# Patient Record
Sex: Female | Born: 1955
Health system: Southern US, Community
[De-identification: ages and names within clinical notes are randomized; demographics above are authoritative.]

## PROBLEM LIST (undated history)

## (undated) DIAGNOSIS — K219 Gastro-esophageal reflux disease without esophagitis: Secondary | ICD-10-CM

## (undated) DIAGNOSIS — G43909 Migraine, unspecified, not intractable, without status migrainosus: Secondary | ICD-10-CM

## (undated) DIAGNOSIS — F419 Anxiety disorder, unspecified: Secondary | ICD-10-CM

## (undated) DIAGNOSIS — F32A Depression, unspecified: Secondary | ICD-10-CM

## (undated) DIAGNOSIS — Z87442 Personal history of urinary calculi: Secondary | ICD-10-CM

## (undated) DIAGNOSIS — I2510 Atherosclerotic heart disease of native coronary artery without angina pectoris: Secondary | ICD-10-CM

## (undated) DIAGNOSIS — K449 Diaphragmatic hernia without obstruction or gangrene: Secondary | ICD-10-CM

## (undated) DIAGNOSIS — G250 Essential tremor: Secondary | ICD-10-CM

## (undated) DIAGNOSIS — E039 Hypothyroidism, unspecified: Secondary | ICD-10-CM

## (undated) DIAGNOSIS — C44309 Unspecified malignant neoplasm of skin of other parts of face: Secondary | ICD-10-CM

## (undated) DIAGNOSIS — Z8719 Personal history of other diseases of the digestive system: Secondary | ICD-10-CM

## (undated) DIAGNOSIS — E785 Hyperlipidemia, unspecified: Secondary | ICD-10-CM

## (undated) DIAGNOSIS — E041 Nontoxic single thyroid nodule: Secondary | ICD-10-CM

## (undated) DIAGNOSIS — I456 Pre-excitation syndrome: Secondary | ICD-10-CM

## (undated) DIAGNOSIS — R739 Hyperglycemia, unspecified: Secondary | ICD-10-CM

## (undated) DIAGNOSIS — I471 Supraventricular tachycardia, unspecified: Secondary | ICD-10-CM

## (undated) DIAGNOSIS — K760 Fatty (change of) liver, not elsewhere classified: Secondary | ICD-10-CM

## (undated) DIAGNOSIS — F329 Major depressive disorder, single episode, unspecified: Secondary | ICD-10-CM

## (undated) DIAGNOSIS — M1990 Unspecified osteoarthritis, unspecified site: Secondary | ICD-10-CM

## (undated) HISTORY — PX: ABLATION: SHX5711

## (undated) HISTORY — DX: Diaphragmatic hernia without obstruction or gangrene: K44.9

## (undated) HISTORY — DX: Supraventricular tachycardia: I47.1

## (undated) HISTORY — DX: Personal history of other diseases of the digestive system: Z87.19

## (undated) HISTORY — DX: Fatty (change of) liver, not elsewhere classified: K76.0

## (undated) HISTORY — DX: Nontoxic single thyroid nodule: E04.1

## (undated) HISTORY — DX: Atherosclerotic heart disease of native coronary artery without angina pectoris: I25.10

## (undated) HISTORY — DX: Essential tremor: G25.0

## (undated) HISTORY — PX: TUBAL LIGATION: SHX77

## (undated) HISTORY — DX: Hyperlipidemia, unspecified: E78.5

## (undated) HISTORY — DX: Pre-excitation syndrome: I45.6

## (undated) HISTORY — DX: Hypothyroidism, unspecified: E03.9

## (undated) HISTORY — DX: Hyperglycemia, unspecified: R73.9

## (undated) HISTORY — DX: Supraventricular tachycardia, unspecified: I47.10

## (undated) HISTORY — PX: LAPAROSCOPIC CHOLECYSTECTOMY W/ CHOLANGIOGRAPHY: SUR757

## (undated) HISTORY — DX: Gastro-esophageal reflux disease without esophagitis: K21.9

## (undated) HISTORY — PX: MOHS SURGERY: SUR867

## (undated) HISTORY — PX: BIOPSY THYROID: PRO38

---

## 1998-09-19 ENCOUNTER — Emergency Department (HOSPITAL_COMMUNITY): Admission: EM | Admit: 1998-09-19 | Discharge: 1998-09-19 | Payer: Self-pay

## 1998-09-29 ENCOUNTER — Encounter: Payer: Self-pay | Admitting: Endocrinology

## 1998-09-29 ENCOUNTER — Ambulatory Visit (HOSPITAL_COMMUNITY): Admission: RE | Admit: 1998-09-29 | Discharge: 1998-09-29 | Payer: Self-pay | Admitting: Endocrinology

## 1998-09-30 ENCOUNTER — Ambulatory Visit (HOSPITAL_COMMUNITY): Admission: RE | Admit: 1998-09-30 | Discharge: 1998-09-30 | Payer: Self-pay | Admitting: Endocrinology

## 1999-05-04 ENCOUNTER — Emergency Department (HOSPITAL_COMMUNITY): Admission: EM | Admit: 1999-05-04 | Discharge: 1999-05-04 | Payer: Self-pay | Admitting: Emergency Medicine

## 1999-05-04 ENCOUNTER — Encounter: Payer: Self-pay | Admitting: Endocrinology

## 1999-10-14 ENCOUNTER — Other Ambulatory Visit: Admission: RE | Admit: 1999-10-14 | Discharge: 1999-10-14 | Payer: Self-pay | Admitting: Obstetrics and Gynecology

## 2000-12-15 ENCOUNTER — Other Ambulatory Visit: Admission: RE | Admit: 2000-12-15 | Discharge: 2000-12-15 | Payer: Self-pay | Admitting: Obstetrics and Gynecology

## 2001-12-19 ENCOUNTER — Other Ambulatory Visit: Admission: RE | Admit: 2001-12-19 | Discharge: 2001-12-19 | Payer: Self-pay | Admitting: Obstetrics and Gynecology

## 2002-11-03 ENCOUNTER — Emergency Department (HOSPITAL_COMMUNITY): Admission: EM | Admit: 2002-11-03 | Discharge: 2002-11-04 | Payer: Self-pay | Admitting: Emergency Medicine

## 2002-12-25 ENCOUNTER — Other Ambulatory Visit: Admission: RE | Admit: 2002-12-25 | Discharge: 2002-12-25 | Payer: Self-pay | Admitting: Obstetrics and Gynecology

## 2004-01-02 ENCOUNTER — Other Ambulatory Visit: Admission: RE | Admit: 2004-01-02 | Discharge: 2004-01-02 | Payer: Self-pay | Admitting: Obstetrics and Gynecology

## 2004-07-15 ENCOUNTER — Encounter: Payer: Self-pay | Admitting: Internal Medicine

## 2004-08-03 ENCOUNTER — Encounter: Admission: RE | Admit: 2004-08-03 | Discharge: 2004-08-03 | Payer: Self-pay | Admitting: Internal Medicine

## 2004-09-03 ENCOUNTER — Ambulatory Visit (HOSPITAL_COMMUNITY): Admission: RE | Admit: 2004-09-03 | Discharge: 2004-09-03 | Payer: Self-pay | Admitting: Urology

## 2004-09-09 ENCOUNTER — Ambulatory Visit: Payer: Self-pay | Admitting: Cardiology

## 2004-09-16 ENCOUNTER — Ambulatory Visit: Payer: Self-pay | Admitting: Cardiology

## 2004-10-17 ENCOUNTER — Emergency Department (HOSPITAL_COMMUNITY): Admission: EM | Admit: 2004-10-17 | Discharge: 2004-10-17 | Payer: Self-pay | Admitting: Emergency Medicine

## 2004-10-22 ENCOUNTER — Ambulatory Visit: Payer: Self-pay | Admitting: *Deleted

## 2004-11-16 ENCOUNTER — Ambulatory Visit: Payer: Self-pay | Admitting: Cardiology

## 2004-12-11 ENCOUNTER — Ambulatory Visit: Payer: Self-pay | Admitting: Cardiology

## 2005-01-06 ENCOUNTER — Other Ambulatory Visit: Admission: RE | Admit: 2005-01-06 | Discharge: 2005-01-06 | Payer: Self-pay | Admitting: Obstetrics and Gynecology

## 2005-01-12 ENCOUNTER — Encounter: Admission: RE | Admit: 2005-01-12 | Discharge: 2005-04-12 | Payer: Self-pay | Admitting: Obstetrics and Gynecology

## 2005-07-08 ENCOUNTER — Ambulatory Visit: Payer: Self-pay | Admitting: Cardiology

## 2005-08-04 ENCOUNTER — Emergency Department (HOSPITAL_COMMUNITY): Admission: EM | Admit: 2005-08-04 | Discharge: 2005-08-04 | Payer: Self-pay | Admitting: Emergency Medicine

## 2005-08-19 ENCOUNTER — Ambulatory Visit: Payer: Self-pay

## 2005-08-23 ENCOUNTER — Ambulatory Visit: Payer: Self-pay

## 2005-09-02 ENCOUNTER — Ambulatory Visit: Payer: Self-pay | Admitting: Cardiology

## 2005-11-01 HISTORY — PX: CARDIAC ELECTROPHYSIOLOGY MAPPING AND ABLATION: SHX1292

## 2005-11-24 ENCOUNTER — Emergency Department (HOSPITAL_COMMUNITY): Admission: EM | Admit: 2005-11-24 | Discharge: 2005-11-24 | Payer: Self-pay | Admitting: Emergency Medicine

## 2005-12-16 ENCOUNTER — Ambulatory Visit: Payer: Self-pay | Admitting: Cardiology

## 2006-01-03 ENCOUNTER — Ambulatory Visit: Payer: Self-pay | Admitting: Internal Medicine

## 2006-01-10 ENCOUNTER — Inpatient Hospital Stay (HOSPITAL_COMMUNITY): Admission: AD | Admit: 2006-01-10 | Discharge: 2006-01-10 | Payer: Self-pay | Admitting: Internal Medicine

## 2006-01-10 ENCOUNTER — Ambulatory Visit: Payer: Self-pay | Admitting: Internal Medicine

## 2006-01-18 ENCOUNTER — Other Ambulatory Visit: Admission: RE | Admit: 2006-01-18 | Discharge: 2006-01-18 | Payer: Self-pay | Admitting: Obstetrics and Gynecology

## 2006-01-18 ENCOUNTER — Ambulatory Visit: Payer: Self-pay | Admitting: Cardiology

## 2006-01-31 ENCOUNTER — Other Ambulatory Visit: Admission: RE | Admit: 2006-01-31 | Discharge: 2006-01-31 | Payer: Self-pay | Admitting: Interventional Radiology

## 2006-01-31 ENCOUNTER — Encounter: Admission: RE | Admit: 2006-01-31 | Discharge: 2006-01-31 | Payer: Self-pay | Admitting: Internal Medicine

## 2006-01-31 ENCOUNTER — Encounter (INDEPENDENT_AMBULATORY_CARE_PROVIDER_SITE_OTHER): Payer: Self-pay | Admitting: Specialist

## 2006-02-02 ENCOUNTER — Inpatient Hospital Stay (HOSPITAL_COMMUNITY): Admission: AD | Admit: 2006-02-02 | Discharge: 2006-02-04 | Payer: Self-pay | Admitting: Cardiology

## 2006-02-02 ENCOUNTER — Ambulatory Visit: Payer: Self-pay | Admitting: Cardiology

## 2006-02-04 ENCOUNTER — Encounter (INDEPENDENT_AMBULATORY_CARE_PROVIDER_SITE_OTHER): Payer: Self-pay | Admitting: *Deleted

## 2006-02-21 ENCOUNTER — Ambulatory Visit: Payer: Self-pay | Admitting: Internal Medicine

## 2006-03-25 ENCOUNTER — Ambulatory Visit: Payer: Self-pay | Admitting: Cardiology

## 2007-06-07 ENCOUNTER — Encounter: Admission: RE | Admit: 2007-06-07 | Discharge: 2007-06-07 | Payer: Self-pay | Admitting: Internal Medicine

## 2007-07-20 ENCOUNTER — Other Ambulatory Visit: Admission: RE | Admit: 2007-07-20 | Discharge: 2007-07-20 | Payer: Self-pay | Admitting: Diagnostic Radiology

## 2007-07-20 ENCOUNTER — Encounter (INDEPENDENT_AMBULATORY_CARE_PROVIDER_SITE_OTHER): Payer: Self-pay | Admitting: Diagnostic Radiology

## 2007-07-20 ENCOUNTER — Encounter: Admission: RE | Admit: 2007-07-20 | Discharge: 2007-07-20 | Payer: Self-pay | Admitting: General Surgery

## 2007-09-13 ENCOUNTER — Emergency Department (HOSPITAL_COMMUNITY): Admission: EM | Admit: 2007-09-13 | Discharge: 2007-09-14 | Payer: Self-pay | Admitting: Emergency Medicine

## 2007-09-13 ENCOUNTER — Encounter (INDEPENDENT_AMBULATORY_CARE_PROVIDER_SITE_OTHER): Payer: Self-pay | Admitting: *Deleted

## 2007-10-03 ENCOUNTER — Encounter: Admission: RE | Admit: 2007-10-03 | Discharge: 2007-10-03 | Payer: Self-pay | Admitting: Internal Medicine

## 2007-11-08 ENCOUNTER — Encounter (INDEPENDENT_AMBULATORY_CARE_PROVIDER_SITE_OTHER): Payer: Self-pay | Admitting: General Surgery

## 2007-11-08 ENCOUNTER — Ambulatory Visit (HOSPITAL_COMMUNITY): Admission: RE | Admit: 2007-11-08 | Discharge: 2007-11-09 | Payer: Self-pay | Admitting: General Surgery

## 2007-11-08 HISTORY — PX: TOTAL THYROIDECTOMY: SHX2547

## 2007-11-12 ENCOUNTER — Emergency Department (HOSPITAL_COMMUNITY): Admission: EM | Admit: 2007-11-12 | Discharge: 2007-11-12 | Payer: Self-pay | Admitting: Emergency Medicine

## 2008-07-31 ENCOUNTER — Encounter: Admission: RE | Admit: 2008-07-31 | Discharge: 2008-07-31 | Payer: Self-pay | Admitting: Internal Medicine

## 2008-07-31 ENCOUNTER — Encounter (INDEPENDENT_AMBULATORY_CARE_PROVIDER_SITE_OTHER): Payer: Self-pay | Admitting: *Deleted

## 2008-08-21 ENCOUNTER — Encounter (INDEPENDENT_AMBULATORY_CARE_PROVIDER_SITE_OTHER): Payer: Self-pay | Admitting: *Deleted

## 2008-08-21 ENCOUNTER — Encounter (INDEPENDENT_AMBULATORY_CARE_PROVIDER_SITE_OTHER): Payer: Self-pay | Admitting: General Surgery

## 2008-08-21 ENCOUNTER — Ambulatory Visit (HOSPITAL_COMMUNITY): Admission: RE | Admit: 2008-08-21 | Discharge: 2008-08-22 | Payer: Self-pay | Admitting: General Surgery

## 2008-12-25 ENCOUNTER — Ambulatory Visit: Payer: Self-pay | Admitting: Cardiology

## 2008-12-25 ENCOUNTER — Encounter: Payer: Self-pay | Admitting: Physician Assistant

## 2008-12-25 DIAGNOSIS — I471 Supraventricular tachycardia: Secondary | ICD-10-CM | POA: Insufficient documentation

## 2008-12-25 DIAGNOSIS — R079 Chest pain, unspecified: Secondary | ICD-10-CM | POA: Insufficient documentation

## 2008-12-25 DIAGNOSIS — E785 Hyperlipidemia, unspecified: Secondary | ICD-10-CM | POA: Insufficient documentation

## 2008-12-25 DIAGNOSIS — R12 Heartburn: Secondary | ICD-10-CM | POA: Insufficient documentation

## 2008-12-25 DIAGNOSIS — E781 Pure hyperglyceridemia: Secondary | ICD-10-CM | POA: Insufficient documentation

## 2009-01-06 ENCOUNTER — Telehealth: Payer: Self-pay | Admitting: Internal Medicine

## 2009-01-07 DIAGNOSIS — E039 Hypothyroidism, unspecified: Secondary | ICD-10-CM | POA: Insufficient documentation

## 2009-01-07 DIAGNOSIS — E041 Nontoxic single thyroid nodule: Secondary | ICD-10-CM | POA: Insufficient documentation

## 2009-01-07 DIAGNOSIS — Z87442 Personal history of urinary calculi: Secondary | ICD-10-CM | POA: Insufficient documentation

## 2009-01-07 DIAGNOSIS — K589 Irritable bowel syndrome without diarrhea: Secondary | ICD-10-CM | POA: Insufficient documentation

## 2009-01-08 ENCOUNTER — Ambulatory Visit: Payer: Self-pay | Admitting: Internal Medicine

## 2009-01-08 DIAGNOSIS — Z9089 Acquired absence of other organs: Secondary | ICD-10-CM | POA: Insufficient documentation

## 2009-01-27 ENCOUNTER — Ambulatory Visit: Payer: Self-pay | Admitting: Internal Medicine

## 2009-01-29 ENCOUNTER — Encounter: Payer: Self-pay | Admitting: Internal Medicine

## 2009-02-11 ENCOUNTER — Telehealth: Payer: Self-pay | Admitting: Internal Medicine

## 2009-02-24 ENCOUNTER — Ambulatory Visit: Payer: Self-pay | Admitting: Internal Medicine

## 2009-02-25 LAB — CONVERTED CEMR LAB
ALT: 39 units/L — ABNORMAL HIGH (ref 0–35)
AST: 35 units/L (ref 0–37)
Albumin: 4.2 g/dL (ref 3.5–5.2)
Alkaline Phosphatase: 44 units/L (ref 39–117)
Bilirubin, Direct: 0.1 mg/dL (ref 0.0–0.3)
Total Bilirubin: 0.5 mg/dL (ref 0.3–1.2)
Total Protein: 7.6 g/dL (ref 6.0–8.3)

## 2009-02-26 LAB — CONVERTED CEMR LAB: Tissue Transglutaminase Ab, IgA: 1.8 units (ref <7)

## 2009-03-22 IMAGING — CR DG ABDOMEN ACUTE W/ 1V CHEST
3 series · 3 of 3 positions shown · non-contrast
Comparison: none

CLINICAL DATA: Nausea, weakness, vomiting.  
ACUTE ABDOMINAL SERIES:

[w chest pa]
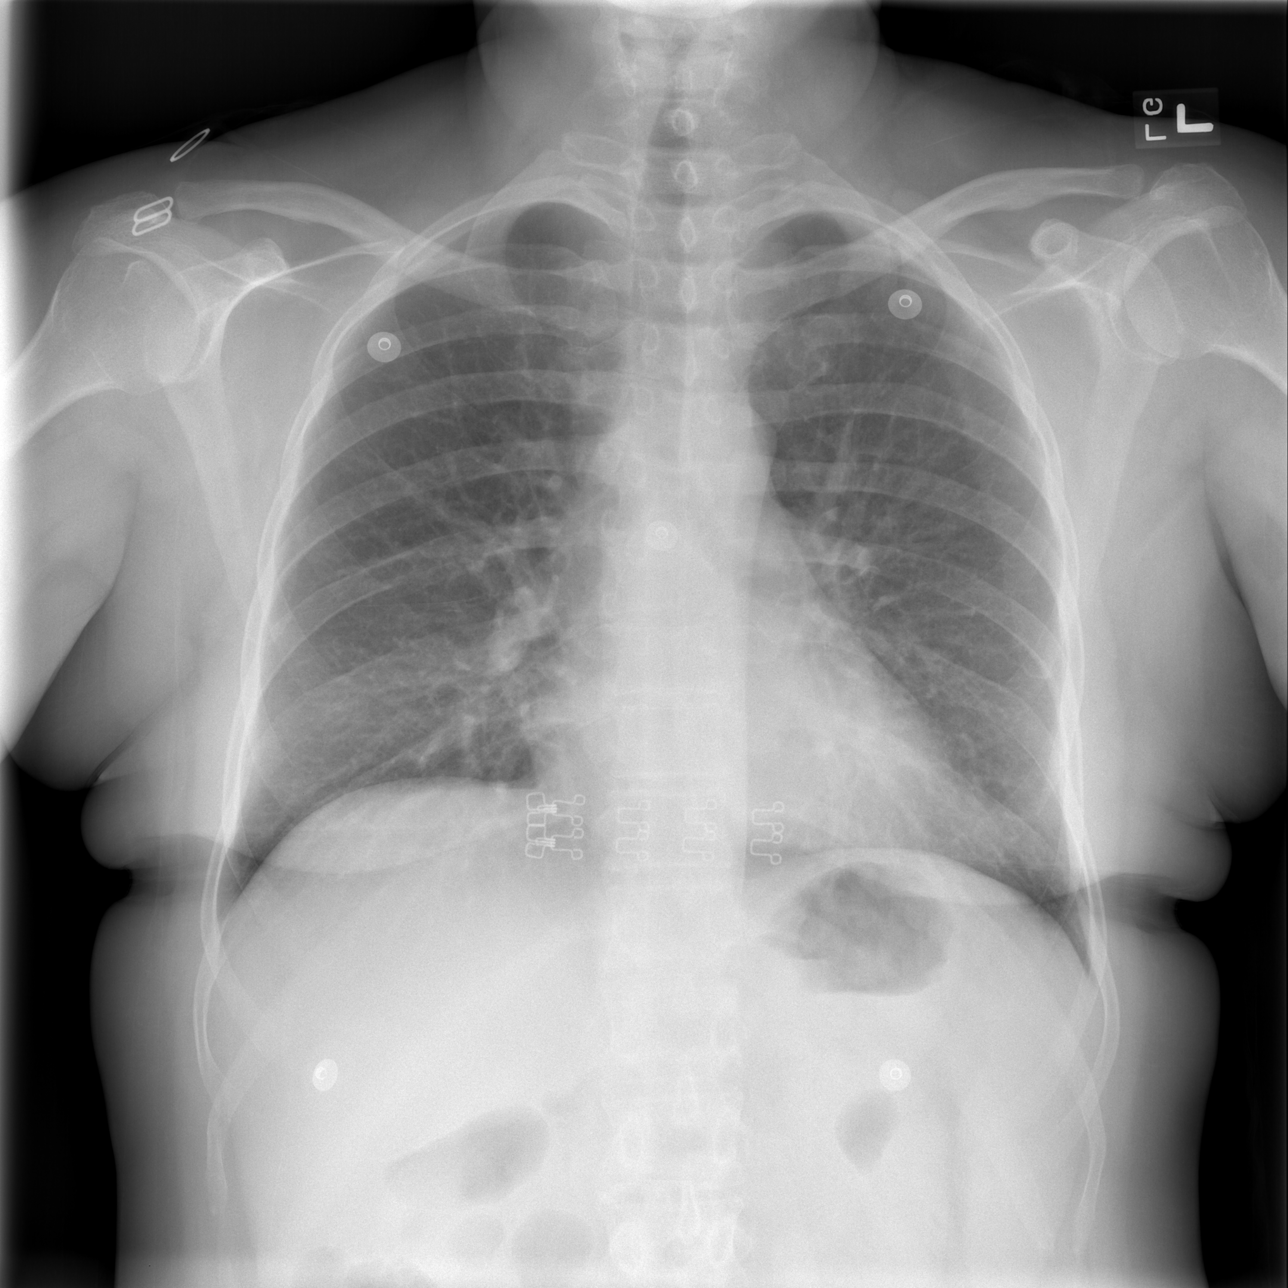

[w abdomen upright *]
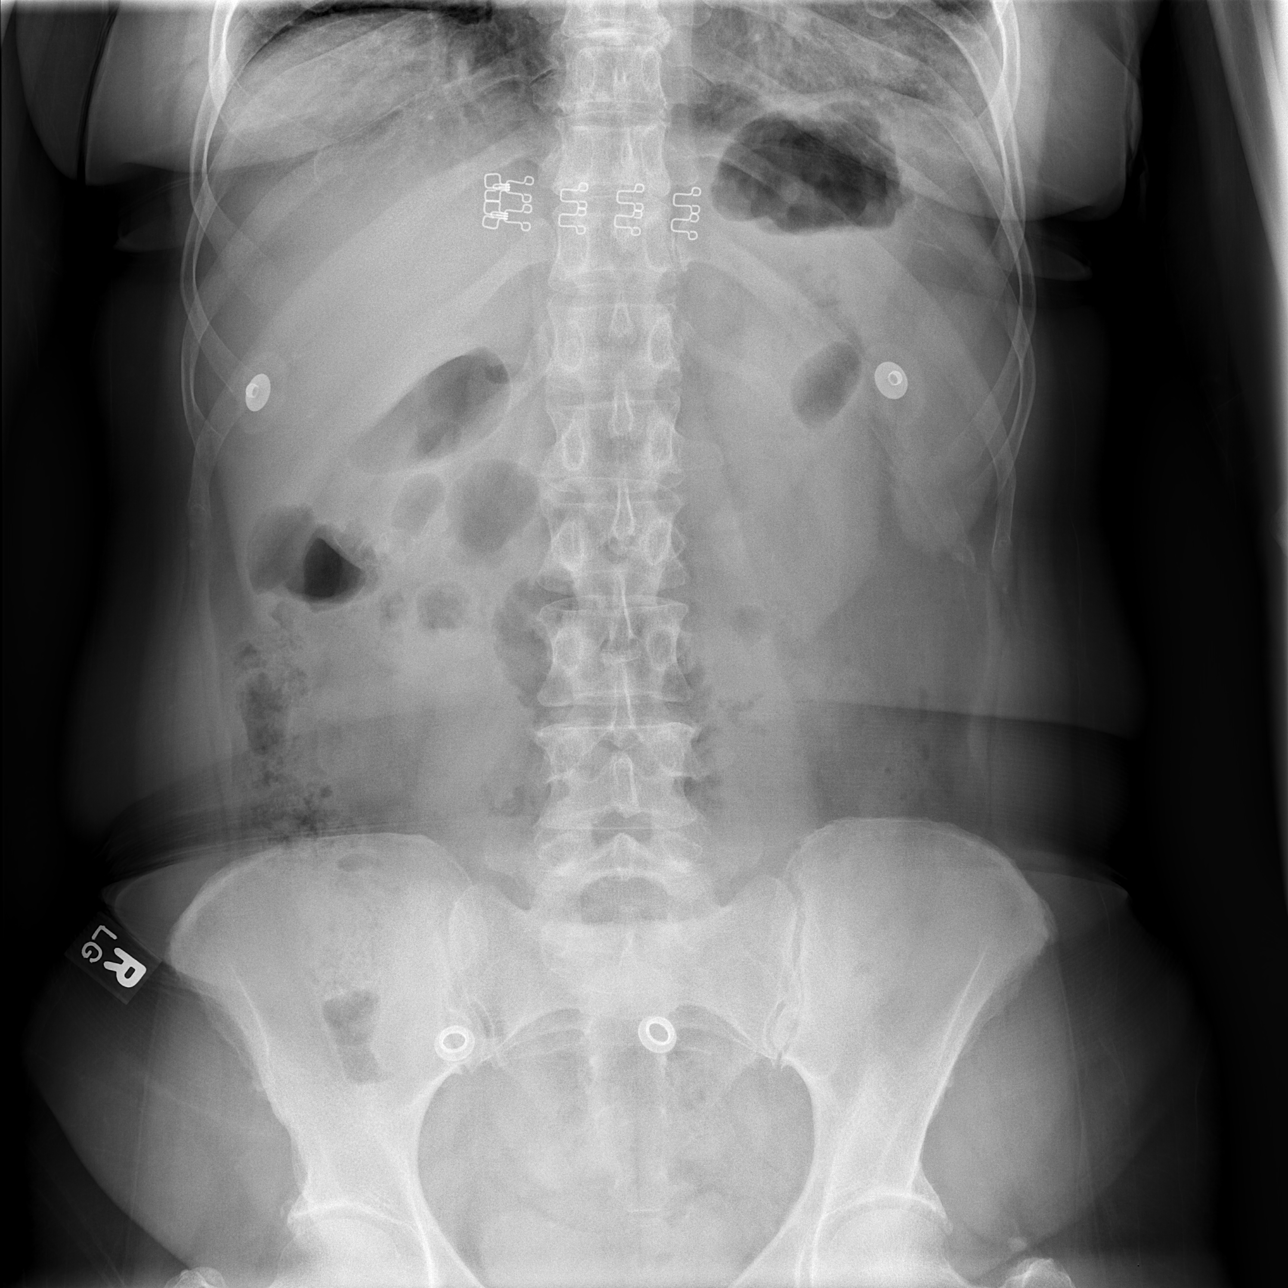

[t abdomen supine]
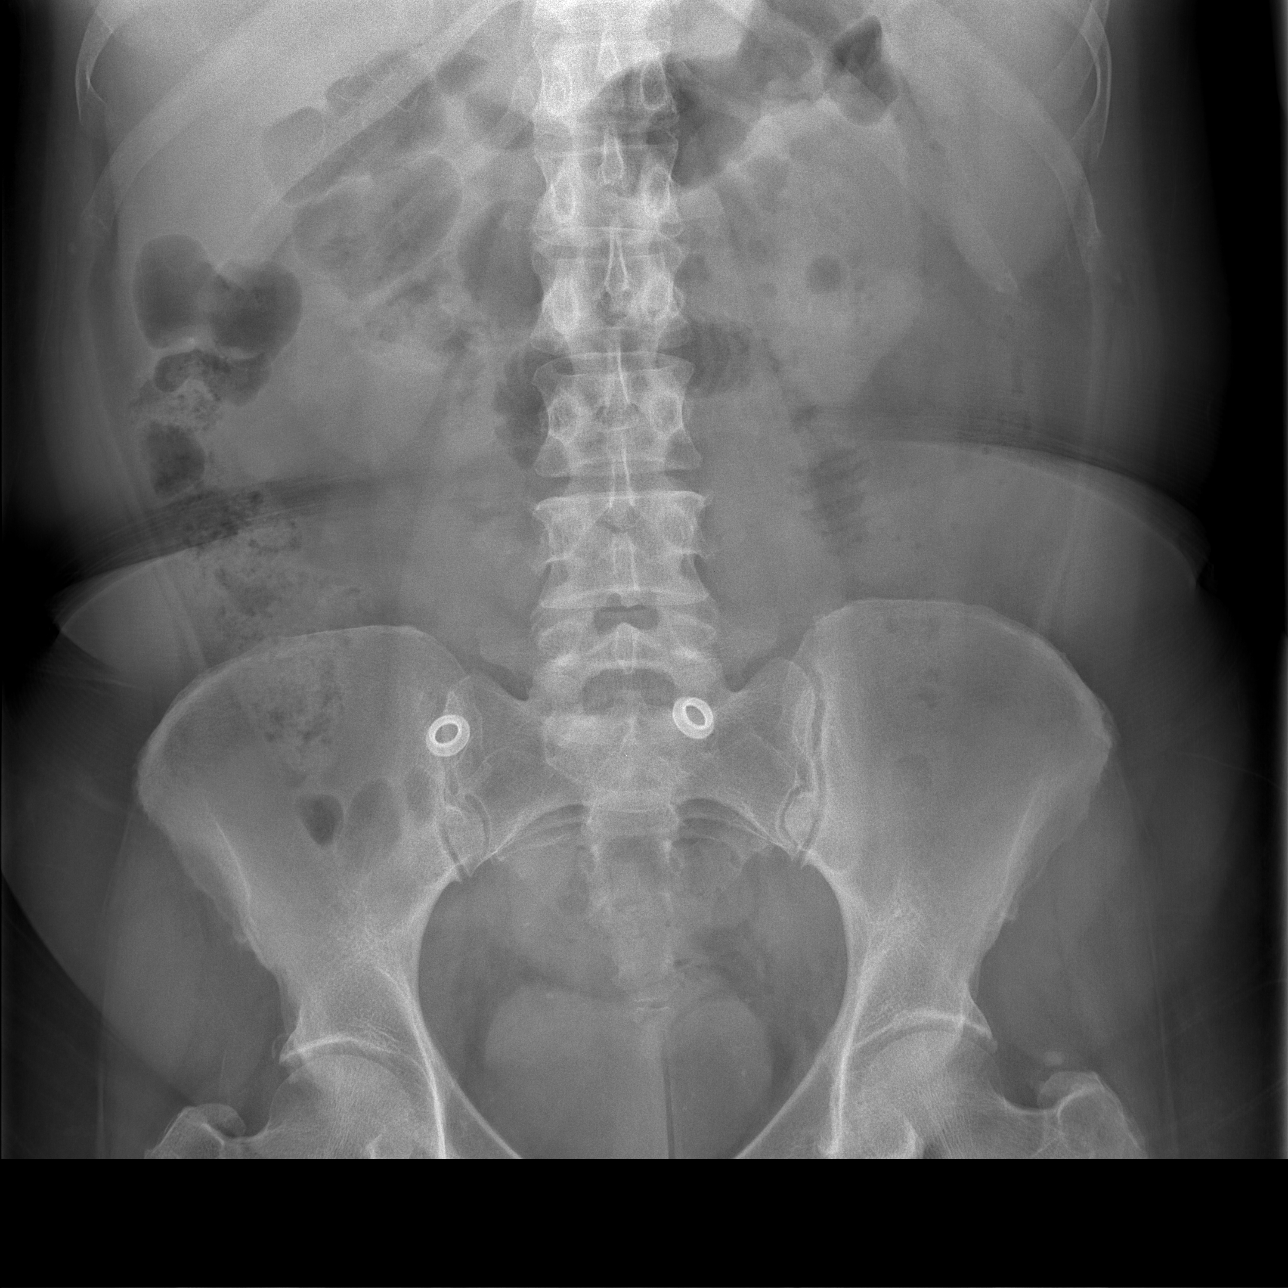

[3 of 3 positions shown; findings below may reference images not displayed]

FINDINGS: Negative for active cardiopulmonary disease.  Minimal accentuation peribronchial and interstitial markings compatible with chronic changes similar in appear to 08/04/05 CXR.  
Single loop of gas filled but not particularly distended small bowel in the central abdomen, an unimpressive finding.  No findings to suggest bowel obstruction.  No free air.  Well-defined psoas muscle margins and properitoneal fat stripes.
IMPRESSION: Minimal small bowel distention compatible with minimal adynamic ileus.

## 2009-10-27 ENCOUNTER — Telehealth: Payer: Self-pay | Admitting: Cardiology

## 2009-11-27 ENCOUNTER — Ambulatory Visit: Payer: Self-pay | Admitting: Cardiology

## 2010-02-07 IMAGING — US US ABDOMEN COMPLETE
1 series · 14 of 25 positions shown · non-contrast
Comparison: CT dated 08/03/2004.

CLINICAL DATA: Upper abdominal pain and nausea after eating.

ABDOMEN ULTRASOUND
TECHNIQUE: Complete abdominal ultrasound examination was performed
including evaluation of the liver, gallbladder, bile ducts,
pancreas, kidneys, spleen, IVC, and abdominal aorta.

[Series 1: us abdomen complete · 0.29mm/px · 14 of 83 slices shown]
[im 1/83]
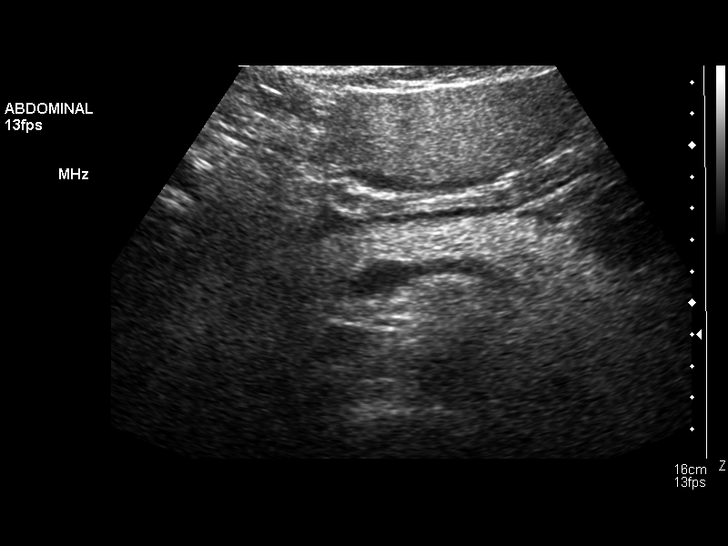
[im 7/83]
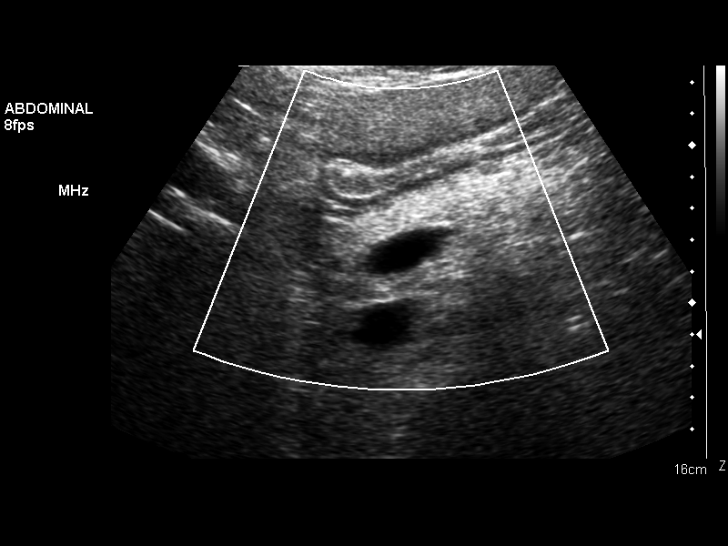
[im 14/83]
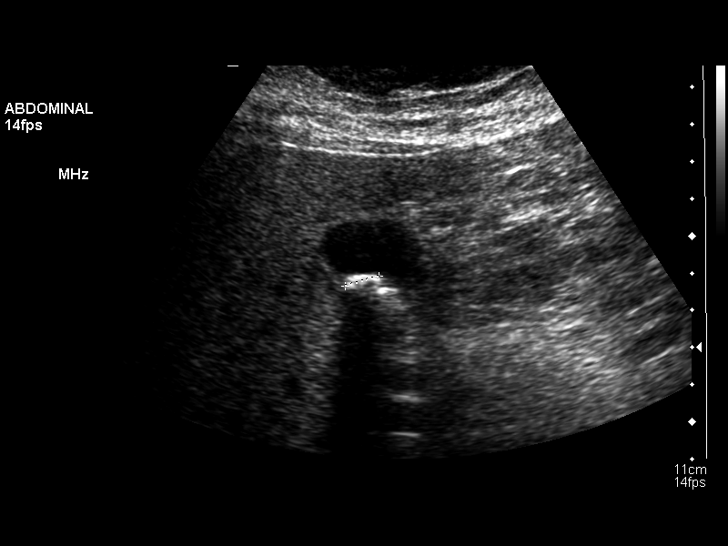
[im 21/83]
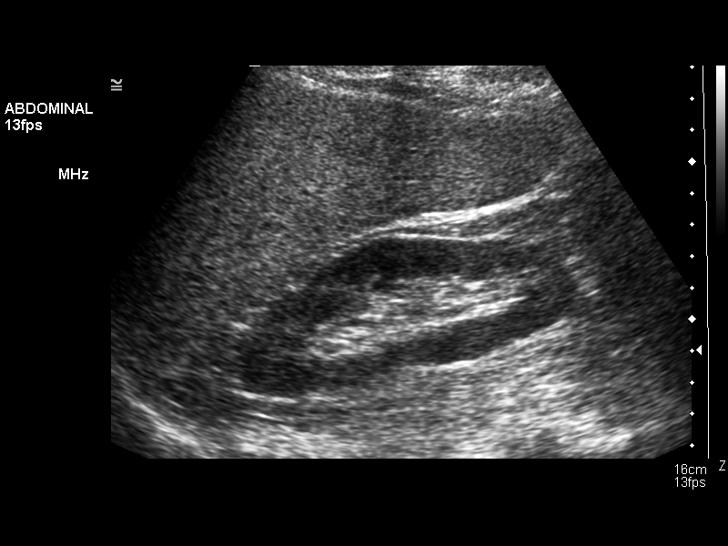
[im 28/83]
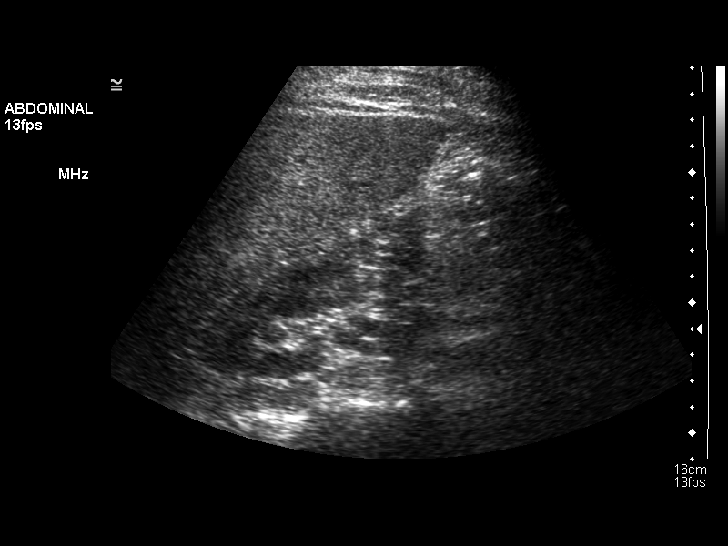
[im 31/83]
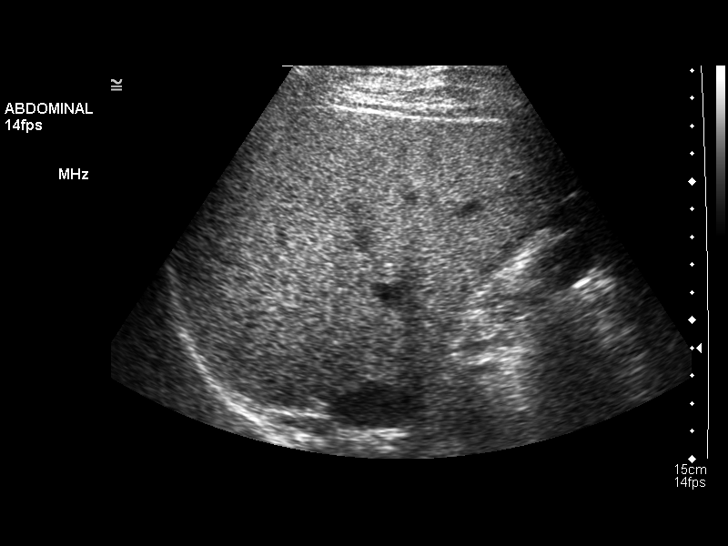
[im 38/83]
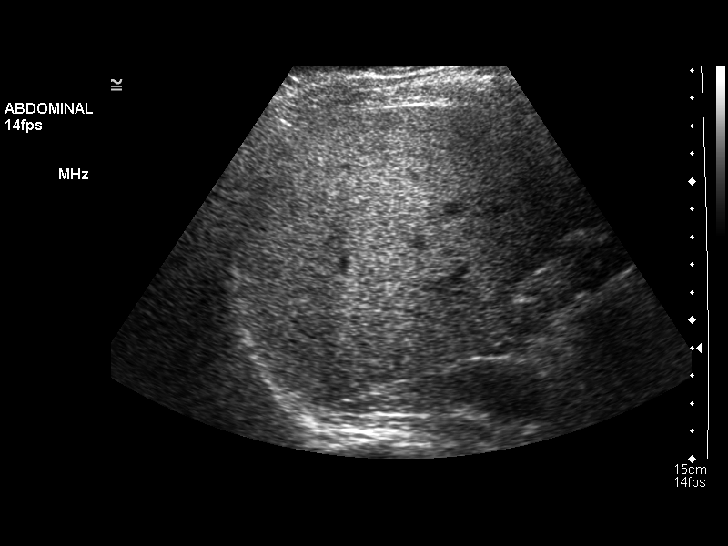
[im 45/83]
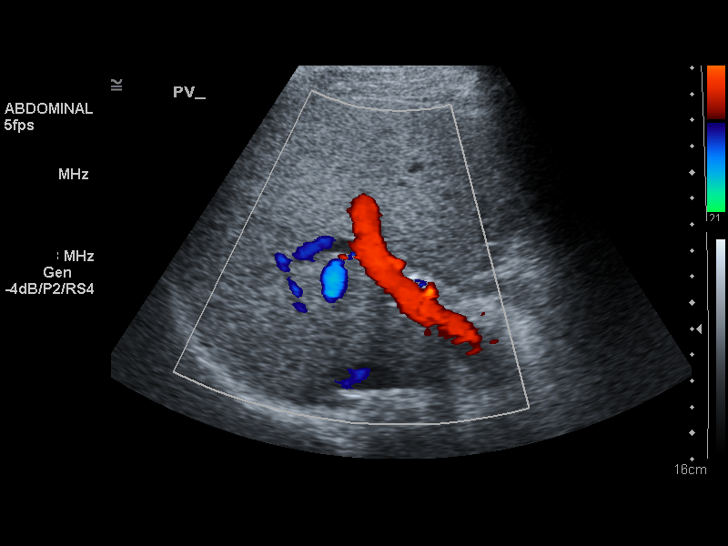
[im 52/83]
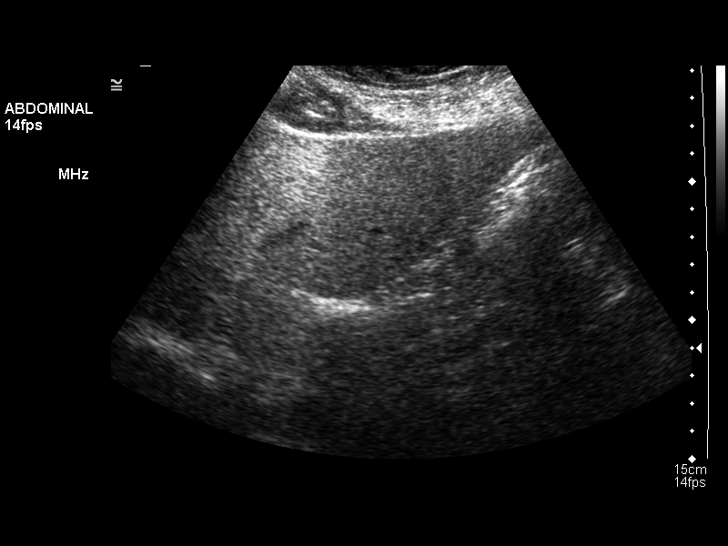
[im 55/83]
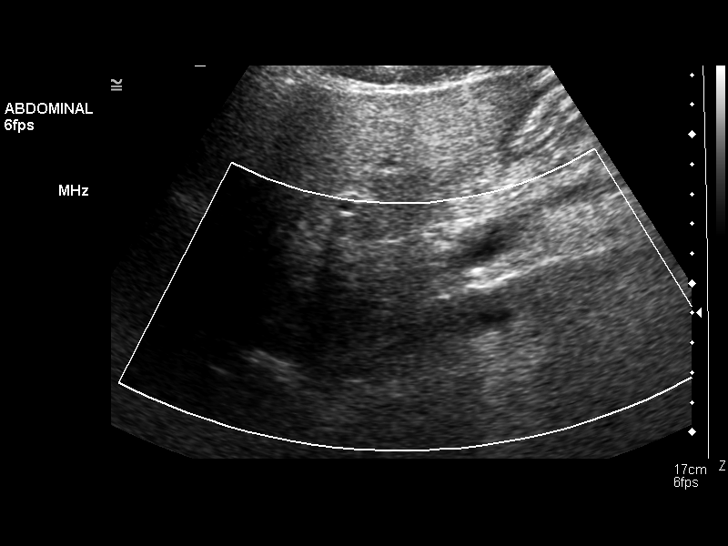
[im 62/83]
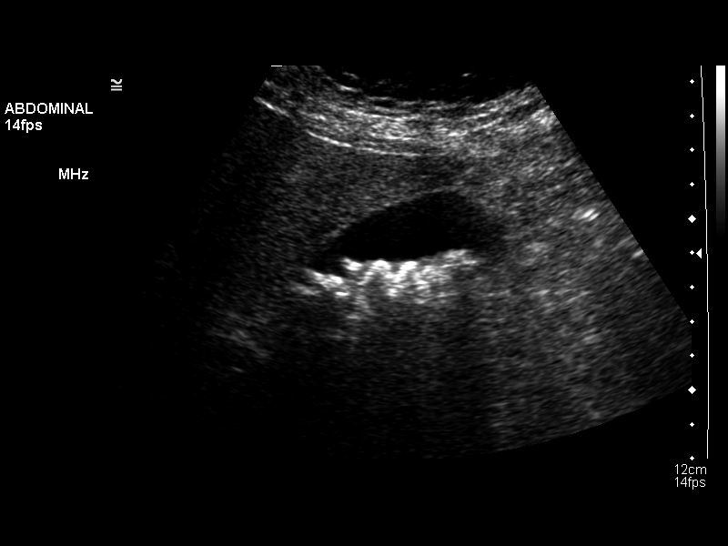
[im 69/83]
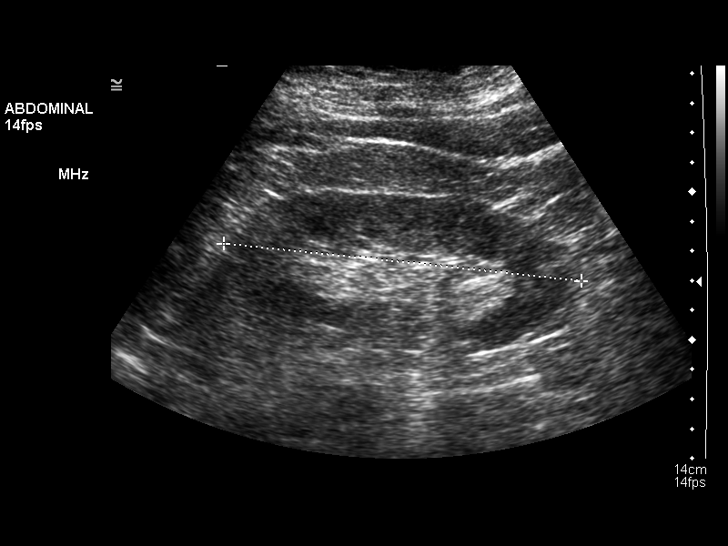
[im 76/83]
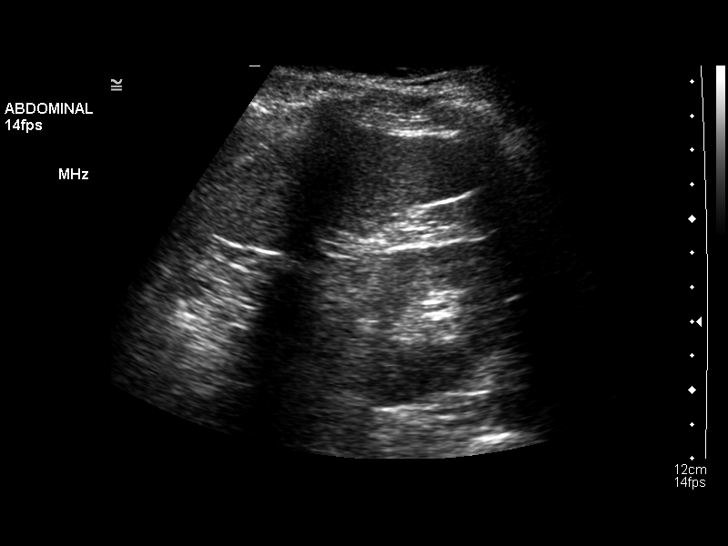
[im 83/83]
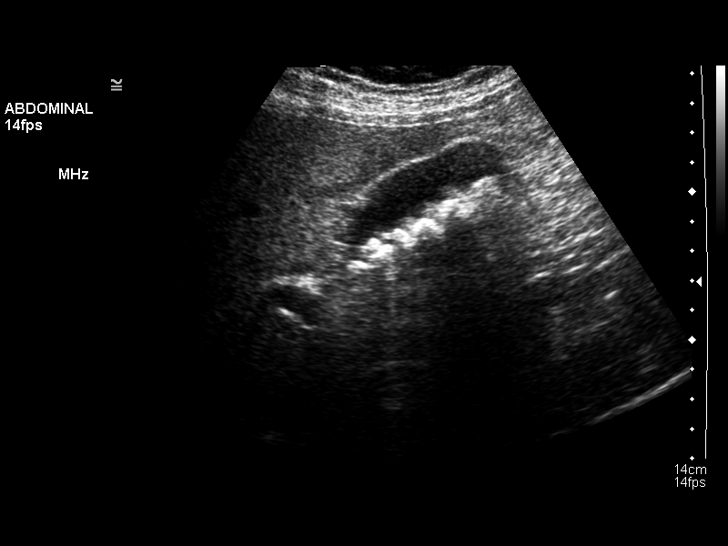

[14 of 25 positions shown; findings below may reference images not displayed]

FINDINGS: Multiple gallstones in the gallbladder with no
gallbladder wall thickening or pericholecystic fluid.  The patient
was mildly tender over the gallbladder.  The largest stone measures
9.5 mm in maximum diameter.  Normal appearing liver, spleen,
pancreas, kidneys, abdominal aorta and inferior vena cava.  No
biliary ductal dilatation or free peritoneal fluid.  The proximal
common duct measures 2.8 mm in maximum diameter.
IMPRESSION: Cholelithiasis.  Otherwise, normal examination.

## 2010-02-18 ENCOUNTER — Telehealth: Payer: Self-pay | Admitting: Internal Medicine

## 2010-12-03 NOTE — Assessment & Plan Note (Signed)
Summary: Cardiology Office Visit   Chief Complaint:  discomfort under left breast.   Updated Prior Medication List: TRICOR 145 MG TABS (FENOFIBRATE) one tab daily LEVOXYL 100 MCG TABS (LEVOTHYROXINE SODIUM) one daily PRILOSEC 20 MG CPDR (OMEPRAZOLE) one tab daily CALCIUM CARBONATE-VITAMIN D 600-400 MG-UNIT  TABS (CALCIUM CARBONATE-VITAMIN D) one tab daily  Current Allergies (reviewed today): ! CODEINE   Vital Signs:  Patient Profile:   55 Years Old Female Height:     66 inches Pulse rate:   82 / minute BP sitting:   122 / 87  (left arm)  Vitals Entered By: Burnett Kanaris, CMA (December 25, 2008 11:01 AM)                   Appended Document: Cardiology Office Visit     PCP:  Dr. Timothy Lasso   History of Present Illness: this is a very pleasant 55 year old white female patient of Dr. Bonnee Quin who has a history of SVT, status post catheter ablation. She had a cardiac catheterization back in 2007 which showed normal coronary arteries and LV function.   The patient presents today with several different types of chest pain. She has been seeing a chiropractor for chest pain that originates under her left breast and goes around to her back. He manipulated her 2 days ago, and she's been more sore in that area. She is due to see him again today to be realigned. She also has some burning chest pain that feels similar to her indigestion that she had when her cardiac catheter was normal. She said her mother passed away a few months ago and she is under is tremendous amount of stress. She is quite tearful and knows that this may be playing a large role in her chest pain. Her irritable bowel syndrome he is acting up as well. She is trying to take care of her father. When she exercises, she has no chest pain. The patient has associated belching with her chest pain.   Prior Medication List:  TRICOR 145 MG TABS (FENOFIBRATE) one tab daily LEVOXYL 100 MCG TABS (LEVOTHYROXINE SODIUM) one  daily PRILOSEC 20 MG CPDR (OMEPRAZOLE) one tab daily CALCIUM CARBONATE-VITAMIN D 600-400 MG-UNIT  TABS (CALCIUM CARBONATE-VITAMIN D) one tab daily   Updated Prior Medication List: TRICOR 145 MG TABS (FENOFIBRATE) one tab daily LEVOXYL 100 MCG TABS (LEVOTHYROXINE SODIUM) one daily PRILOSEC 20 MG CPDR (OMEPRAZOLE) one tab daily CALCIUM CARBONATE-VITAMIN D 600-400 MG-UNIT  TABS (CALCIUM CARBONATE-VITAMIN D) one tab daily  Current Allergies: ! CODEINE  Review of Systems       The patient complains of chest pain and severe indigestion/heartburn.         all other symptoms negative   Vital Signs:  Patient Profile:   55 Years Old Female Height:     66 inches Pulse rate:   93 / minute BP sitting:   122 / 87                Physical Exam  General:     Well developed, well nourished, in no acute distress. Head:     normocephalic and atraumatic Eyes:     PERRLA/EOM intact; conjunctiva and lids normal. Neck:     Neck supple, no JVD. No masses, thyromegaly or abnormal cervical nodes. Chest Wall:     no deformities or breast masses noted Lungs:     Clear bilaterally to auscultation and percussion. Heart:     Non-displaced PMI, chest non-tender; regular rate and  rhythm, S1, S2 without murmurs, rubs or gallops. Carotid upstroke normal, no bruit. Normal abdominal aortic size, no bruits. Femorals normal pulses, no bruits. Pedals normal pulses. No edema, no varicosities. Abdomen:     Bowel sounds positive; abdomen soft and non-tender without masses, organomegaly, or hernias noted. No hepatosplenomegaly. Msk:     Back normal, normal gait. Muscle strength and tone normal. Pulses:     pulses normal in all 4 extremities Extremities:     No clubbing or cyanosis. Neurologic:     Alert and oriented x 3.    Impression & Recommendations:  Problem # 1:  CHEST PAIN-UNSPECIFIED (ICD-786.50) patient had a normal cardiac catheterization in 2007. I believe her chest pain is currently  related to her increased stress and possible GI symptoms.  Problem # 2:  SVT/ PSVT/ PAT (ICD-427.0) she is currently not having any palpitations.  Problem # 3:  HEARTBURN (ICD-787.1) I asked the patient to make an appointment to see Dr. Julio Alm concerning her heartburn irritable bowel syndrome and need for colonoscopy.I also told her to increase her Prilosec to 40 mg daily.   Patient Instructions: 1)  Your physician recommends that you schedule a follow-up appointment with Dr. [name]. She is to call us if she has ongoing problems and we will reassess her. She can see Dr. Riley Kill back if needed.

## 2010-12-03 NOTE — Progress Notes (Signed)
Summary: Medication refill  Phone Note Call from Patient   Caller: Patient Call For: Dr. Juanda Chance Reason for Call: Refill Medication Summary of Call: Refill OMEPRAZOLE 20 MG....Nexium is too expensive Initial call taken by: Karna Christmas,  February 18, 2010 10:31 AM  Follow-up for Phone Call        Prescription sent. Follow-up by: Hortense Ramal CMA Duncan Dull),  February 18, 2010 2:36 PM    New/Updated Medications: OMEPRAZOLE 20 MG CPDR (OMEPRAZOLE) Take 1 tablet by mouth once a day Prescriptions: OMEPRAZOLE 20 MG CPDR (OMEPRAZOLE) Take 1 tablet by mouth once a day  #30 x 5   Entered by:   Hortense Ramal CMA (AAMA)   Authorized by:   Hart Carwin MD   Signed by:   Hortense Ramal CMA (AAMA) on 02/18/2010   Method used:   Electronically to        [facility] (retail)       [address]       [place], [place]  [address]       Ph: [phone]       Fax: [phone]   RxID:   [id number]

## 2010-12-03 NOTE — Assessment & Plan Note (Signed)
Summary: ROV   Visit Type:  Follow-up Referring Provider:  n/a Primary Provider:  Dr Timothy Lasso  CC:  no complains.  History of Present Illness: No symptoms.  Generally doing well.  Denies any chest pain.  Staying active.  Has gone on weight watchers  and loss a few pounds.  Exercising regularly at present by going to the gym three times per week.  No other complaints.  Walking every day.    Current Medications (verified): 1)  Tricor 145 Mg Tabs (Fenofibrate) .... One Tab Daily 2)  Levoxyl 112 Mcg Tabs (Levothyroxine Sodium) .Marland Kitchen.. 1 By Mouth Once Daily 3)  Calcium Carbonate-Vitamin D 600-400 Mg-Unit  Tabs (Calcium Carbonate-Vitamin D) .... One Tab Daily 4)  Prilosec Otc 20 Mg Tbec (Omeprazole Magnesium) .... Take 1 Tablet By Mouth Once A Day  Allergies: 1)  ! Codeine  Vital Signs:  Patient profile:   55 year old female Height:      66 inches Weight:      165.75 pounds BMI:     26.85 Pulse rate:   72 / minute Pulse rhythm:   regular Resp:     18 per minute BP sitting:   100 / 72  (left arm) Cuff size:   large  Vitals Entered By: Vikki Ports (November 27, 2009 10:35 AM)  Physical Exam  General:  Well developed, well nourished, in no acute distress. Lungs:  Clear bilaterally to auscultation and percussion. Heart:  PMI non displaced.  Normal S1 and S2.  No def murmur.   Msk:  Back normal, normal gait. Muscle strength and tone normal. Pulses:  pulses normal in all 4 extremities Extremities:  No clubbing or cyanosis. Neurologic:  Alert and oriented x 3.   EKG  Procedure date:  11/27/2009  Findings:      Normal Sinus rhythm.  WNL  Impression & Recommendations:  Problem # 1:  SVT/ PSVT/ PAT (ICD-427.0)  Had prior ablation without symptomatic recurrence.  Orders: EKG w/ Interpretation (93000)  Problem # 2:  HYPOTHYROIDISM (ICD-244.9) On replacement with Dr. Timothy Lasso. Her updated medication list for this problem includes:    Levoxyl 112 Mcg Tabs (Levothyroxine sodium)  .Marland Kitchen... 1 by mouth once daily  Problem # 3:  HYPERTRIGLYCERIDEMIA (ICD-272.1) Followed by Dr. Timothy Lasso Her updated medication list for this problem includes:    Tricor 145 Mg Tabs (Fenofibrate) ..... One tab daily  Problem # 4:  CHEST PAIN-UNSPECIFIED (ICD-786.50)  Last cath in 2007.  Orders: EKG w/ Interpretation (93000)  Patient Instructions: 1)  Your physician recommends that you schedule a follow-up appointment as needed.  2)  Your physician recommends that you continue on your current medications as directed. Please refer to the Current Medication list given to you today.

## 2011-01-25 ENCOUNTER — Other Ambulatory Visit: Payer: Self-pay | Admitting: Internal Medicine

## 2011-02-23 ENCOUNTER — Other Ambulatory Visit: Payer: Self-pay | Admitting: Obstetrics and Gynecology

## 2011-03-16 NOTE — Op Note (Signed)
NAMEJERNEE, MURTAUGH                 ACCOUNT NO.:  [id number]   MEDICAL RECORD NO.:  [id number]          PATIENT TYPE:  AMB   LOCATION:  SDS                          FACILITY:  [facility]   PHYSICIAN:  Angelia Mould. Derrell Lolling, M.D.DATE OF BIRTH:  1955/12/15   DATE OF PROCEDURE:  11/08/2007  DATE OF DISCHARGE:                               OPERATIVE REPORT   PREOPERATIVE DIAGNOSIS:  Multinodular goiter, suspect Hashimoto's  thyroiditis.   POSTOPERATIVE DIAGNOSIS:  Multinodular goiter, suspect Hashimoto's  thyroiditis.   OPERATION PERFORMED:  Total thyroidectomy.   SURGEON:  Dr. Claud Kelp.   FIRST ASSISTANT:  Dr. Glenna Fellows.   OPERATIVE INDICATIONS:  This is a 55 year old white female who has been  on the thyroid hormone replacement for 30 years.  She has noticed some  progressive enlargement of her thyroid gland most notably on the right  side.  She has some occasional trouble swallowing.  Thyroid ultrasounds  have been done in 2007 and again recently, and these show varying  findings, but basically bilateral nodules and probably some enlargement.  Fine needle aspiration cytology showed follicular epithelial cells,  histiocytes, and lymphocytes.  Because of the enlargement of the thyroid  nodules while on Synthroid replacement and the swallowing symptoms and  the notable palpable nodule, she wanted to have this removed.  I felt  this was reasonable, and she was brought to operating room for total  thyroidectomy.   OPERATIVE FINDINGS:  The patient had bilateral enlargement of both  thyroid lobes, and both of them had firm nodules.  The gland was very  sticky and required very careful dissection from the surrounding strap  muscles and the deep tracheal tissues.  We clearly identified both  recurrent laryngeal nerves, and there were preserved.  We felt like we  identified both superior parathyroid glands and preserved them.  We felt  like we identified the right inferior  parathyroid gland and preserved  that.  We felt that on the left side we did not see the inferior  parathyroid gland, but the dissection was kept so close to the capsule  that we felt it was most likely preserved.   OPERATIVE TECHNIQUE:  Following induction of general endotracheal  anesthesia, the patient's neck was extended, and she was placed in  slight reverse Trendelenburg position.  The arms were at the side, and  the patient's neck and chest were prepped and draped in sterile fashion.  The patient was identified as correct patient, correct procedure.  Intravenous antibiotics were given prior to the incision being made.  A  curved transverse incision was made in the anterior neck about 2 cm  above the suprasternal notch.  Dissection was carried down through the  subcutaneous tissue and platysmal muscle.  Skin and platysmal flaps were  raised superiorly and inferiorly.  Self-retaining retractor was placed.  The strap muscles were divided in the midline.  One midline vein had to  be isolated, clamped, divided and ligated with 3-0 silk ties.   On both sides, we slowly dissected the strap muscles off of the thyroid  gland.  Thyroid  gland on both sides was very firm and was not very  mobile.  I kept the dissection right on top of the thyroid capsule in  all regards.  Smaller vessels and areolar tissue were divided with the  harmonic scalpel.  Larger vessels were controlled with metal clips and  silk ties.   On both sides, we took down the superior pole, very carefully dissecting  and isolating the superior thyroid vessels.  We very carefully searched  and preserved both superior parathyroid glands.  On both sides, the  superior thyroid artery was isolated, ligated in continuity with 2-0  silk ties and secured with a metal clip above the last tie for further  security and then divided.  Superior pole was taken down and off of the  thyroid and cricoid cartilages.  On both sides, we  mobilized the  inferior pole.  This was fairly generous and divided the small vascular  attachments with the harmonic scalpel.  On both sides, we then mobilized  the thyroid lobe from lateral to medial.  We identified the recurrent  laryngeal nerve on both sides and noted that it was preserved as it  entered into the airway below the thyroid cartilage.  The middle thyroid  vein and inferior thyroid artery were controlled metal clips and  divided.  The thyroid gland was then dissected off the trachea.  I  marked the left superior pole with a silk suture.  It was sent for  routine histology.  The wound was copiously irrigated with saline.  Hemostasis was excellent.  I placed Surgicel gauze in both sides of the  thyroid bed watched for about 4 or 5 minutes, and it was completely dry.  The strap muscles were closed in the midline with interrupted sutures of  3-0 Vicryl.  The platysma muscle was closed with interrupted sutures of  3-0 Vicryl.  Skin was closed with a running subcuticular suture of 4-0  Monocryl and Dermabond.  Clean bandages were placed and the patient  taken recovery room in stable condition.  Estimated blood loss was about  40 mL or less. Complications:  None.  Sponge, needle and instrument  counts were correct.      Angelia Mould. Derrell Lolling, M.D.  Electronically Signed     HMI/MEDQ  D:  11/08/2007  T:  11/08/2007  Job:  [id number]   cc:   Gwen Pounds, MD

## 2011-03-16 NOTE — Op Note (Signed)
[name], [name]                 ACCOUNT NO.:  [id number]   MEDICAL RECORD NO.:  [id number]          PATIENT TYPE:  OIB   LOCATION:  5159                         FACILITY:  [facility]   PHYSICIAN:  Angelia Mould. Derrell Lolling, M.D.DATE OF BIRTH:  1956/01/08   DATE OF PROCEDURE:  08/21/2008  DATE OF DISCHARGE:                               OPERATIVE REPORT   PREOPERATIVE DIAGNOSIS:  Chronic cholecystitis with cholelithiasis.   POSTOPERATIVE DIAGNOSIS:  Chronic cholecystitis with cholelithiasis.   OPERATION PERFORMED:  Laparoscopic cholecystectomy with intraoperative  cholangiogram.   SURGEON:  Angelia Mould. Derrell Lolling, MD   FIRST ASSISTANT:  Letha Cape, PA   OPERATIVE INDICATIONS:  This is a 55 year old white female who has a  long history of irritable bowel syndrome.  She presented recently with 3-  week history of daily, postprandial epigastric dull pain radiating to  the back with associated nausea and lasting 2 hours or so and then  subsiding.  She also complained of bloating and belching with this.  An  ultrasound was performed which showed multiple gallstones but was  otherwise unremarkable.  She was counseled as an outpatient.  Cholecystectomy was offered.  She is admitted for that surgery.   OPERATIVE FINDINGS:  The gallbladder was thin walled and was not acutely  inflamed.  There were some notable adhesions to the gallbladder which  were chronic and relatively soft.  The cholangiogram was normal, showing  normal intrahepatic and extrahepatic bile ducts, no filling defects, and  no obstruction with good flow of contrast into the duodenum.  The liver,  stomach, duodenum, small intestine, and large intestine were grossly  normal to inspection.   OPERATIVE TECHNIQUE:  Following induction of general endotracheal  anesthesia, the patient's abdomen was prepped and draped in a sterile  fashion.  The patient was identified as to correct patient and correct  procedure.  Intravenous  antibiotics were given.  Marcaine 0.5% with  epinephrine was used as local infiltration anesthetic.  A vertically  oriented incision was made inside the lower rim of the umbilicus.  The  fascia was incised in the midline, and the abdominal cavity entered  under direct vision.  A 11-mm Hasson trocar was inserted and secured  with pursestring suture of 0 Vicryl.  Pneumoperitoneum was created.  Video camera was inserted with visualization and findings as described  above.  A 11-mm trocar was placed in the subxiphoid region and two 5-mm  trocars were placed in the right upper quadrant.  The gallbladder fundus  was easily identified and elevated with a grasper.  The adhesions were  slowly taken down with blunt dissection and cautery.  We then cleaned  off the infundibulum of the gallbladder completely and grasped it and  retracted it laterally.  We dissected out the cystic duct and cystic  artery.  The cystic artery was isolated as it went onto the wall of the  gallbladder, secured with multiple metal clips and divided.  The cystic  duct was cleaned off and a large window created behind the cystic duct.  A cholangiogram catheter was inserted into the cystic  duct.  A  cholangiogram was obtained using the C-arm.  The cholangiogram was  normal as described above.  The cholangiogram catheter was removed, the  cystic duct secured with multiple metal clips and divided.  The  gallbladder was dissected from its bed with electrocautery, placed a  specimen bag, and removed.  The operative field was copiously irrigated  with saline.  The irrigation fluid was completely clear, as there was no  bleeding or bile leak whatsoever.  All of the fluid was removed.  The  trocars were removed under direct vision.  There was no bleeding from  trocar sites.  Pneumoperitoneum was released.  The fascia at the  umbilicus was closed with 2 figure-of-eight sutures of 0 Vicryl.  Skin  incisions were closed with  subcuticular sutures of 4-0 Monocryl.  Clean  bandages were placed, and the patient was taken to the recovery room in  stable condition.   ESTIMATED BLOOD LOSS:  About 10 mL.   COMPLICATIONS:  None.   Sponge, needle, and instrument counts were correct.      Angelia Mould. Derrell Lolling, M.D.  Electronically Signed     HMI/MEDQ  D:  08/21/2008  T:  08/21/2008  Job:  [id number]   cc:   Gwen Pounds, MD

## 2011-03-19 NOTE — Discharge Summary (Signed)
[name], [name]                 ACCOUNT NO.:  [id number]   MEDICAL RECORD NO.:  [id number]          PATIENT TYPE:  INP   LOCATION:  4705                         FACILITY:  [facility]   PHYSICIAN:  Doylene Canning. Ladona Ridgel, M.D.  DATE OF BIRTH:  1956/10/27   DATE OF ADMISSION:  01/10/2006  DATE OF DISCHARGE:  01/10/2006                                 DISCHARGE SUMMARY   PRIMARY CARE PHYSICIAN:  Gwen Pounds, MD   ALLERGIES:  CODEINE.   PRINCIPAL DIAGNOSES:  1.  A seven-year history of tachypalpitations with increasing frequency and      severity.  2.  Discharging day one of successful electrophysiology study, induction of      AVNRT with successful slow P-wave modification, Dr. Lewayne Bunting      practitioner.   SECONDARY DIAGNOSES:  1.  History of symptomatic short RP tachyarrhythmias.  2.  Hypothyroidism, treated.  3.  Hypertriglyceridemia.  4.  Family history of coronary artery disease.  5.  Borderline hyperglycemia.   PROCEDURE:  On January 10, 2006, electrophysiology study, radiofrequency  catheter ablation of AV nodal re-entrant tachycardia with successful slow P-  wave modification, Dr. Lewayne Bunting.   BRIEF HISTORY:  Ms. Colquitt is a 55 year old female. She has a history of  tachypalpitations of seven years in duration from initial diagnosis.  Episodes start abruptly. They can cause dyspnea, diaphoresis, or chest  tightness. She has had some control over the years with beta blockade taking  an extra beta blocker for rescue if tachypalpitations do start. She has come  to the emergency room four times over the years for an adenosine challenge.   His episodes are now getting more frequent. Her last one was about one and a  half months ago. Description on presentation at that time, November 24, 2005,  demonstrated short RV tachycardia. This was done at the [facility] emergency room. The patient now presented for SVT  ablation. She has seen Dr. Ladona Ridgel in consult on  January 03, 2006. She has had  no new clinical features developing since the period of the last visit.   HOSPITAL COURSE:  The patient presented to [facility] on January 10, 2006. She underwent electrophysiology study with radiofrequency catheter  ablation of an AV nodal re-entry tachycardia. There was no inducible SVT  after ablation. The patient has had no recurrence of SVT in the post  procedure. She is in sinus rhythm. Her catheterization sites are without  swelling, erythema, or drainage. The patient is alert and oriented and  wishes to go home.   DISCHARGE MEDICATIONS:  She is discharged on the following medications:  1.  Levoxyl 50 mcg daily.  2.  Tricor 145 mg daily.  3.  Enteric-coated aspirin 81 mg daily.  4.  Toprol XL 25 mg one-half tablet daily for the next week and then      discontinue.   She is asked not to lift anything heavier than 10 pounds for the next two  week, not to drive for the next two days. She will follow a  low sodium, low  cholesterol diet. She may shower. She is to call [phone] for any increased  pain, swelling, or bleeding from the catheter site. She has a follow-up at  [facility] on [address] on Monday, April 23rd at 9:15  a.m.   Laboratory studies for this admission were drawn on March 5th. A complete  blood count revealed white count of 9.9, hemoglobin 14.7, hematocrit 43.7,  platelet count 406,000, PT 13.2, INR 1.1, PTT 28.2. Serum electrolytes  revealed sodium of 139, potassium 4, chloride 106, bicarbonate 28, glucose  108, BUN 15, creatinine 1.      Maple Mirza, P.A.    ______________________________  Doylene Canning. Ladona Ridgel, M.D.    GM/MEDQ  D:  01/10/2006  T:  01/11/2006  Job:  [id number]   cc:   Gwen Pounds, MD  Fax: [phone]   Arturo Morton. Riley Kill, M.D. [facility]  [address]  [address]  [place]  [place] [address]   Doylene Canning. Ladona Ridgel, M.D.  [address]  [address]  [place]  [place] [address]

## 2011-03-19 NOTE — Cardiovascular Report (Signed)
[name], [name]                 ACCOUNT NO.:  [id number]   MEDICAL RECORD NO.:  [id number]          PATIENT TYPE:  INP   LOCATION:  4713                         FACILITY:  [facility]   PHYSICIAN:  [name]. [name], M.D. [facility] OF BIRTH:  [birth year]   DATE OF PROCEDURE:  02/03/2006  DATE OF DISCHARGE:  02/04/2006                              CARDIAC CATHETERIZATION   INDICATIONS:  [name] is well-known to me.  I have taken care of her parents  for many years.  She has had a recent ablation.  She presents with some  recurrent substernal chest pain.  This has been going on long before her  ablation therapy.  She has multiple risk factors for coronary artery  disease.  We had talked about the possibility of catheterization,  previously.  She presented, at this time, with some recurrent substernal  chest pain.   PROCEDURE:  1.  Left heart catheterization.  2.  Selective coronary arteriography.  3.  Selective left ventriculography.  4.  Aortic root aortography.   DESCRIPTION OF PROCEDURE:  The patient was brought to the catheterization  laboratory after informed consent and prepped and draped in the usual  fashion.  Through an anterior puncture the right femoral artery was easily  entered.  A 5-French sheath was placed.  Views of the left and right  coronary arteries were obtained in multiple angiographic projections.  Central aortic and left atrial pressures were measured with a pigtail.  Ventriculography was performed in the RAO projection.  Following the  pressure pullback an LAO aortogram was performed to rule out dissection.  The patient tolerated procedure without complication.  She was taken to the  holding area in satisfactory clinical condition.   HEMODYNAMIC DATA:  1.  Central aorta pressure 104/63, mean 83.  2.  Left atrial pressure 108/7  3.  No gradient or pullback across aortic valve.   ANGIOGRAPHIC DATA:  1.  Aortic root aortography reveals no evidence of aortic  dissection.  The      aortic leaflets appear to move well.  2.  Ventriculography was done in the RAO projection.  Overall LV function      was vigorous with ejection fraction appearing to be in excess of 60%.      There was ventricular ectopy to the pigtail catheter.  3.  The left main is very short and essentially there are 2 ostia for the      LAD and the circumflex.  4.  Left anterior descending artery courses to the apex.  There is a dense      area of calcification in the mid vessel just after the takeoff of the      diagonal branch.  There is eccentric plaque at this location measuring      about 30% luminal reduction.  Critical stenoses is not noted in any      view.  There is a second diagonal branch in the distal LAD and is free      of critical disease.  5.  The circumflex provides a major marginal branch, than a smaller  posterolateral branch.  The circumflex is relatively smooth throughout      without significant focal narrowing.  6.  The right coronary is a large-caliber vessel.  The RCA may have a little      bit of ostial spasm, but there is no damping of the catheter in the RCA.      The vessel appears to be smooth throughout providing a posterior      descending and then a bifurcating posterolateral branch all of which      appear to be free of critical disease.   CONCLUSIONS:  1.  Well-preserved overall left ventricular function.  2.  No evidence of aortic dissection.  3.  Calcification of the mid left anterior descending artery with mild 30%      eccentric plaquing.  4.  No critical obstruction of the circumflex or right coronary artery.   DISPOSITION:  We will check a D-dimer given her recent procedure that  involves venous structures.  Secondly, if this is negative, we will consider  referral for endoscopy.      [name]. [name], M.D. [facility]  Electronically Signed     TDS/MEDQ  D:  02/03/2006  T:  02/04/2006  Job:  [id number]   cc:   Jennifer Pounds, MD   Fax: [phone]   [name]. [name], M.D. [facility]  [address]  [address]  [place]  [place] [address]   [facility]

## 2011-03-19 NOTE — H&P (Signed)
[name], [name]                 ACCOUNT NO.:  [id number]   MEDICAL RECORD NO.:  [id number]          PATIENT TYPE:  INP   LOCATION:  4713                         FACILITY:  [facility]   PHYSICIAN:  Willa Rough, M.D.     DATE OF BIRTH:  04/17/1956   DATE OF ADMISSION:  02/02/2006  DATE OF DISCHARGE:                                HISTORY & PHYSICAL   PRIMARY CARDIOLOGIST:  Arturo Morton. Riley Kill, M.D. [facility].   ELECTROPHYSIOLOGIST:  Doylene Canning. Ladona Ridgel, M.D.   PRIMARY CARE PHYSICIAN:  Gwen Pounds, M.D.   The patient is also followed by Alfonse Alpers. Dagoberto Ligas, M.D.   HISTORY OF PRESENT ILLNESS:  [name] is a very pleasant 55 year old  Caucasian female who is followed by Dr. Riley Kill and Dr. Ladona Ridgel for a history  of palpitations.  Over the last seven years, the patient has had frequent  bouts of SVT.  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  The patient states she has done  well since that time.  She has had an ongoing complaint of chest pressure  for approximately two years.  She is status post exercise stress, last Fall,  that was without EKG changes, however, the patient did state she did have  some chest discomfort while exercising.  She is also status post a stress  Myoview immediately following the exercise stress that showed no ischemia  with an EF of 70%.  [name] states the chest discomfort does not limit her  activities, nor does she have associated symptoms with the chest discomfort.  However, she states this episode of chest pressure began around 2 a.m. last  night.  She states the chest pressure woke her up.  It began as a pressure  under her left breast and it radiated medial and upward throughout her  sternum.  She states it is constant.  She rates the pressure around a 3 to 4  on a scale of 1 to 10.  It continued through the night, sort of waxes and  wanes.  She took Rolaids without relief.  Husband states she also has had  increased belching and some mild nausea.  She states that she has difficulty  taking a deep breath, however, she denies any shortness of breath or dyspnea  on exertion.  She also has been under increased emotional stress related to  her family.  She cares for both her parents and has a sister and brother who  do not contribute any assistance to the care of their parents.  The patient  also has irritable bowel syndrome and increased GERD symptoms over the last  few days.  She appears to be very tense and anxious just discussing the  family situation.  She currently is complaining of some mild chest pressure,  rating it around a 2.  A STAT EKG was done during my assessment.  It shows a  normal sinus rhythm without any acute ST or T wave changes.   ALLERGIES:  CODEINE.   MEDICATIONS:  1.  Multivitamin.  2.  Levoxyl 0.05 mg daily.  3.  TriCor 145 mg daily.  4.  Aspirin 81 mg daily.  5.  Fish oil.  6.  Tums and Rolaids p.r.n.   PAST MEDICAL HISTORY:  1.  History of SVT, status post EP study with catheter ablation of A-V nodal      re-entry tachycardia, on January 10, 2006, status post adenosine Myoview,      October 2006, showing an EF of 70% with no ischemia.  2.  Hypothyroidism.  3.  Hyperlipidemia.  4.  Borderline hyperglycemia.  5.  History of irritable bowel syndrome.  6.  Kidney stones.  7.  Recent finding of multiple right thyroid nodules.  The patient is status      post thyroid biopsy on this Monday.  At this time the pathology report      on the thyroid biopsy states, follicular epithelial cells, histiocytes,      colloid, and lymphocytes (thyroiditis).  The features suggest a non-      neoplastic process such as a hyperplastic nodule/non-neoplastic goiter      in a background of thyroiditis.  Thyroid biopsy was done under a thyroid      ultrasound at [facility].  8.  C-section x2.   SOCIAL HISTORY:  The patient lives in [place] with her husband.  She is  not  employed outside the home.  She cares for her parents full time.  She  has two adult children.  Denies any tobacco, ETOH, drug, or herbal  medication use.  She follows a regular diet.  No routine exercise program.   FAMILY HISTORY:  Mother alive in her 8s.  She has a history of CAD, status  post coronary artery bypass graft in her 65s.  Father alive with a history  of MI x2, first MI at age 68.  He is status post coronary artery bypass  graft also.  Siblings x2 with no known CAD.   REVIEW OF SYSTEMS:  Positive for chest pressure, anxiety, nausea, diarrhea,  GERD.  All other systems negative per the patient.   PHYSICAL EXAMINATION:  VITAL SIGNS:  Temp 97.7, pulse 89, respirations 20,  blood pressure 113/78.  The patient is sating 98% on 2 liters.  GENERAL:  She is in no acute distress.  She is very anxious, somewhat tense.  Appears stated age.  HEENT:  Pupils are equal, round, and reactive to light.  Normocephalic  atraumatic.  Sclerae is clear.  Dentition, the patient has her own teeth.  NECK:  Supple without lymphadenopathy.  She has multiple palpable right  thyroid nodules.  No carotid bruits.  No JVD.  CARDIOVASCULAR:  Heart rate regular rate and rhythm.  S1 and S2.  Pulses are  2+ and equal without bruits.  LUNGS:  Clear to auscultation.  ABDOMEN:  Soft nontender.  She has positive bowel sounds.  Negative for  hepatosplenomegaly.  EXTREMITIES:  Without clubbing, cyanosis, or edema.  She has 2+ DPs  bilateral.  NEUROLOGIC:  She is alert and oriented x3.  Cranial nerves II-XII grossly  intact.   Chest x-ray is pending.  EKG at a rate of 75, sinus rhythm, without acute ST-  T wave changes.  Lab work is pending at this time.   Dr. Willa Rough in to examine and assess the patient with complaints of  chest discomfort in the setting of hyperlipidemia and strong familial factors for coronary artery disease, also with a history of supraventricular  tachycardia status  post ablation  without complications.  The patient is  under increased emotional and physical stress in caring for two elderly  parents.  She complains of increased irritable bowel/gastrointestinal  symptoms with increased Rolaids use, also a recent finding for multiple  thyroid nodules, status post thyroid biopsy showing thyroiditis.   PLAN:  1.  Cycle cardiac enzymes.  2.  Place the patient on a proton pump inhibitor.  3.  Low dose Xanax p.r.n.  4.  We will work the patient up for thyroiditis with TSH, T4, and T3.  5.  With the patient's strong familial background for CAD and increased      episodes of chest discomfort, we will plan on cardiac catheterization      tomorrow.  Risks and benefits of cardiac catheterization have been      discussed with the patient and she agrees to proceed with      catheterization.  6.  We will also place the patient on Lovenox for prophylactic purposes and      nitroglycerin patch.      Dorian Pod, NP    ______________________________  Willa Rough, M.D.    MB/MEDQ  D:  02/02/2006  T:  02/02/2006  Job:  [id number]

## 2011-03-19 NOTE — Op Note (Signed)
NAMEJENNICE, [name]                 ACCOUNT NO.:  [id number]   MEDICAL RECORD NO.:  [id number]          PATIENT TYPE:  INP   LOCATION:  4705                         FACILITY:  [facility]   PHYSICIAN:  Doylene Canning. Ladona Ridgel, M.D.  DATE OF BIRTH:  1956/07/08   DATE OF PROCEDURE:  01/10/2006  DATE OF DISCHARGE:                                 OPERATIVE REPORT   PROCEDURE PERFORMED:  Electrophysiologic study and catheter ablation of AV  node re-entrant tachycardia.   INTRODUCTION:  The patient is a very pleasant middle-aged woman with a  history of recurrent palpitations with increasingly frequent episodes  supraventricular tachycardia requiring hospitalization.  She has been  terminated in the past with adenosine.  Her SVT is at rates of up to 210  beats per minute.  The patient has had symptoms despite beta blocker therapy  and is now referred for catheter ablation.   PROCEDURE:  After informed consent was obtained, the patient was taken to  diagnostic electrophysiology laboratory in the fasting state.  After the  usual preparation and draping, intravenous fentanyl Midazolam was given for  sedation.  A 6 French hexapolar catheter was inserted percutaneously in the  right jugular vein, advanced to the coronary sinus.  A 5 French quadripolar  catheter was inserted percutaneously in the right femoral vein and advanced  to the right ventricle.  A 5 French quadripolar catheter was inserted  percutaneously in the right femoral vein and advanced to the His bundle  region.  This measured the basic intervals.  Rapid ventricular pacing was  carried out from the RV apex and stepwise decreased down to 320 msec  demonstrating VA Wenckebach.  During rapid ventricular pacing, atrial  activation was midline and decremental.  Next programmed ventricular  stimulation was carried out from the RV apex at a basic drive cycle length  of 161 msec.  S1-S2 interval was stepwise decreased down to 200 msec where  ventricular refractoriness was observed.  During programmed ventricular  stimulation the atrial activation was midline and decremental.  Next  programmed atrial stimulation was carried out from the coronary sinus in the  high right atrium basic drive cycle length 096 msec.  S1-S2 interval was  stepwise decreased from 440 msec down to 270 msec where atrial  refractoriness was observed.  During programmed atrial stimulation, there  were AH jumps and echo beats but no SVT.  Next, rapid atrial pacing was  carried out from the coronary sinus with pace cycle length of 490 msec and  stepwise decreased down to 370 msec where SVT was initiated.  During rapid  atrial pacing the PR interval became greater than the RR interval prior to  SVT initiation.  Tachycardia was somewhat variable, between 300 msec and 270  msec.  Mapping demonstrated midline atrial activation.  PVCs placed at time  of His bundle refractoriness did not pre-excite the atrium and ventricular  pacing during tachycardia demonstrated a VAV conduction sequence.  In  addition, the [place] interval was almost 0.  With all the above, the diagnosis  of AV node re-entrant tachycardia was made  and the 7 [place] quadripolar  ablation catheter was maneuvered into the usual Koch's triangle region and  in this location, a total of three radio frequency energy applications  were  delivered resulting in accelerated junctional rhythm.  Following catheter  ablation, patient observed for approximately 15 minutes and had no recurrent  SVT and no residual slow pathway conduction.  The catheters at this point  were removed.  Hemostasis was assured and the patient was returned to her  room in satisfactory condition.   COMPLICATIONS:  There were no immediate procedure complications.   RESULTS:  a.  Baseline ECG.  Baseline ECG demonstrates sinus rhythm with  normal axis and intervals.  b.  Baseline intervals.  Sinus node cycle length was 710 msec.  The HV   interval 37 msec, QRS duration 100 msec.  c.  Rapid ventricular pacing.  Rapid ventricular pacing was carried out from  the RV apex demonstrating the VA Wenckebach cycle length of 320 msec.  During rapid ventricular pacing the atrial activation was midline and  decremental.  d.  Programmed ventricular stimulation.  Programmed ventricular stimulation  was carried out from the RV apex at basic drive cycle length of 562 msec.  The S1-S2 interval was stepwise decreased down to 200 msec where ventricular  refractoriness was observed.  During programmed ventricular stimulation the  atrial activation was midline and decremental.  e.  Rapid atrial pacing. Rapid atrial pacing was carried out from the  coronary sinus and high right atrium with pace cycle length 600 msec.  Stepwise decreased down to 320 msec where AV Wenckebach was observed.  During rapid atrial pacing, the PR interval was greater than the RR  interval.  f.  Programmed atrial stimulation.  Programmed atrial stimulation was  carried out from the coronary sinus at basic drive cycle length of 130 msec.  The S1-S2 interval was stepwise decreased down to 270 msec where atrial  refractoriness was observed.  During programmed atrial stimulation there  were no AH jumps and echo beats noted.  g.  Arrhythmias observed.  (1) AV node re-entry tachycardia.  Initiation  rapid atrial pacing.  Duration was sustained.  Cycle length was 270 msec.  Method of termination was rapid atrial pacing.  h.  Mapping of patient's tachycardia demonstrated early atrial activation in  the Koch's triangle region.  1.  Radio frequency energy applications. A total of three radio frequency      energy applications were delivered to sites 8 through 10 in Koch's      triangle resulting in accelerated junctional rhythm.  Following ablation      there was no inducible SVT.  CONCLUSION:  This study demonstrates successful electrophysiologic study and  radio frequency  catheter ablation of typical AV node re-entry tachycardia  with a total of three radio frequency energy applications to the slow  pathway region resulting in termination of the tachycardia rendering it  noninducible.           ______________________________  Doylene Canning. Ladona Ridgel, M.D.     GWT/MEDQ  D:  01/10/2006  T:  01/10/2006  Job:  [id number]   cc:   Arturo Morton. Riley Kill, M.D. [facility]  [address]  [address]  [place]  [place] [address]   Gwen Pounds, MD  Fax: [phone]

## 2011-03-19 NOTE — Discharge Summary (Signed)
[name]                 ACCOUNT NO.:  [id number]   MEDICAL RECORD NO.:  [id number]          PATIENT TYPE:  INP   LOCATION:  4713                         FACILITY:  [facility]   PHYSICIAN:  Arturo Morton. Riley Kill, M.D. [facility] OF BIRTH:  1956-03-24   DATE OF ADMISSION:  02/02/2006  DATE OF DISCHARGE:  02/04/2006                                 DISCHARGE SUMMARY   PRIMARY DIAGNOSIS:  Chest pain, no critical coronary artery disease by cath,  suspect gastroesophageal reflux disease.   SECONDARY DIAGNOSES:  1.  History of supraventricular tachycardia, status post ablation of      atrioventricular node reentrant tachycardia January 10, 2006.  2.  Hypothyroidism.  3.  Hyperlipidemia.  4.  Mild hyperglycemia with a blood sugar of 105.  5.  History of irritable bowel syndrome.  6.  History of nephrolithiasis.  7.  History of multiple right thyroid nodules, status post biopsy, felt non-      neoplastic.  8.  Status post cesarean section x2.  9.  Family history of coronary artery disease.  10. Elevated stress and anxiety.   PROCEDURES:  1.  Cardiac catheterization.  2.  Coronary arteriogram.  3.  Left ventriculogram.   TIME AT DISCHARGE:  Less than 30 minutes.   HOSPITAL COURSE:  [name] is a 55 year old female with no known history of  coronary artery disease.  She had a recent ablation of AV node reentrant  tachycardia and has done well since then.  She has a long history of chest  pain that was felt noncardiac in origin and has had a negative stress test.  The chest pain woke her at 3 a.m. and gave her some difficulty taking a deep  breath.  [name] did not relieve it.  She was admitted for further  evaluation and treatment.   Her cardiac enzymes were negative, but it was felt that a cardiac  catheterization was indicated to further define her anatomy.   The cardiac catheterization showed a calcified LAD with 30% stenosis.  She  had some RCA spasm, but no critical disease, no  aortic root dissection, and  a normal LV.  A D-dimer was within normal limits, as well.   [name] felt that she was getting a sinus infection and was given  antibiotics.  Protonix was started for possible reflux symptoms and her  chest pain improved.  She was evaluated by Dr. Riley Kill and considered stable  for discharge on February 04, 2006, with outpatient followup arranged.   DISCHARGE INSTRUCTIONS:  1.  Her activity level is to be decreased for the next few days.  2.  She is to follow up with her primary care physician as needed with Dr.      Ladona Ridgel as scheduled and with Dr. Riley Kill, and the office will call with      an appointment.   DISCHARGE MEDICATIONS:  1.  Levoxyl 50 mcg daily.  2.  TriCor 145 mg daily.  3.  Aspirin 81 mg daily.  4.  Fish oil, multivitamin, and calcium as prior to admission.  5.  Protonix 40 mg daily.  6.  Z-Pak, take as directed.   LABORATORY VALUES:  TSH 0.549 with a free T4 of 1.61 and a free T3 of 3.1.  D-dimer less than 0.22.  BNP less than 20.  Hemoglobin A1c 5.5.  Amylase 63,  lipase 35.  Other laboratory values within normal limits except for a mildly  elevated white count at 12.3.      Theodore Demark, P.A. [facility]      Thomas D. Riley Kill, M.D. [facility]  Electronically Signed    RB/MEDQ  D:  02/04/2006  T:  02/05/2006  Job:  [id number]   cc:   Gwen Pounds, MD  Fax: [phone]   Alfonse Alpers. Dagoberto Ligas, M.D.  Fax: [phone]

## 2011-04-28 ENCOUNTER — Encounter: Payer: Self-pay | Admitting: Physician Assistant

## 2011-04-29 ENCOUNTER — Ambulatory Visit (INDEPENDENT_AMBULATORY_CARE_PROVIDER_SITE_OTHER): Payer: BC Managed Care – PPO | Admitting: Physician Assistant

## 2011-04-29 ENCOUNTER — Encounter: Payer: Self-pay | Admitting: Physician Assistant

## 2011-04-29 VITALS — BP 110/68 | HR 76 | Ht 66.0 in | Wt 167.0 lb

## 2011-04-29 DIAGNOSIS — R002 Palpitations: Secondary | ICD-10-CM

## 2011-04-29 DIAGNOSIS — I471 Supraventricular tachycardia: Secondary | ICD-10-CM

## 2011-04-29 LAB — BASIC METABOLIC PANEL
BUN: 23 mg/dL (ref 6–23)
CO2: 26 mEq/L (ref 19–32)
Chloride: 108 mEq/L (ref 96–112)
Creatinine, Ser: 0.9 mg/dL (ref 0.4–1.2)
Glucose, Bld: 99 mg/dL (ref 70–99)
Potassium: 4.8 mEq/L (ref 3.5–5.1)

## 2011-04-29 LAB — MAGNESIUM: Magnesium: 2.1 mg/dL (ref 1.5–2.5)

## 2011-04-29 LAB — TSH: TSH: 0.74 u[IU]/mL (ref 0.35–5.50)

## 2011-04-29 MED ORDER — METOPROLOL TARTRATE 25 MG PO TABS: ORAL_TABLET | ORAL | Status: DC

## 2011-04-29 NOTE — Patient Instructions (Signed)
Your physician recommends that you schedule a follow-up appointment in: 05/28/11 @ 2:45 with Dr. Riley Kill  Your physician recommends that you return for lab work in: TODAY BMET, MAGNESIUM, TSH 785.1  Your physician has recommended you make the following change in your medication: START METOPROLOL TART 25 MG TAKE ONLY 1/2 (HALF) TAB TWICE DAILY AS NEEDED FOR PALPITATIONS.  WEAN OFF CAFFEINE

## 2011-04-29 NOTE — Assessment & Plan Note (Signed)
Suspect she is having PVC/PACs 2/2 increased caffeine and increased stress.  We discussed decreasing caffeine.  We discussed whether or not to get an event monitor and echo now.  But, she would like to stop drinking the tea first to see if this helps.  Will get a BMET, Mg, TSH today.  Will also give her a Rx for metoprolol tartrate 25 mg 1/2 tab bid prn palpitations.  She will follow up with Dr. Riley Kill in 6 weeks.  If she has worsening or persistent symptoms, she can call prior to her appointment and get an echo and event monitor arranged.

## 2011-04-29 NOTE — Assessment & Plan Note (Signed)
It does not sound like she is having a recurrence.  If symptoms do not improve with reducing caffeine, proceed with echo and event monitor.  Check labs as noted.

## 2011-04-29 NOTE — Progress Notes (Signed)
History of Present Illness: Primary Cardiologist:  Dr.  Shawnie Pons  [name] is a 55 y.o. female with a h/o SVT s/p RFCA in 2007.  She had cardiac cath in 4/07 that demonstrated minimal plaque disease.  She last saw Dr. Riley Kill in 1/11, had no recurrent symptoms of SVT and was to follow up PRN.    She presents today with complaints of palpitations.  She had a fluttering sensation ever since her ablation was performed.  However, over the last couple of weeks, it is increased in frequency.  She denies rapid palpitations.  She denies sustained palpitations like her previous SVT.  She denies chest pain or shortness of breath.  She denies of apnea, PND or edema.  She denies syncope.  She did recently increased her caffeine intake.  She's been drinking a lot of unsweetened iced tea.  Also, her son graduated college.  He leaves in a couple of months to go to the [employer] [place] for his Masters.  Her daughter also got married a week after he graduated.  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  Past Medical History  Diagnosis Date  . SVT (supraventricular tachycardia)     Hx of, s/p EP study with catheter ablation of AV nodal; re-entry tachycardia 01/10/06, s/p adenosine myoview, 10/06 showing an EF of 70% with no ischemia  . Hypothyroidism   . Hyperlipidemia   . Hyperglycemia     Borderline  . History of IBS   . Kidney stones   . Right thyroid nodule      s/p thyroid biopsy; report states follicular epitehlial cells, histiocytes, colloid, and lymphocytes (thyroiditis). Suggest a non-neoplastic process such as a hyperplastic nodule/ non-neoplastic goiter in a background of thyroiditis. Thyroid biopsy was done under a thyroid ultrasound at [facility].  Marland Kitchen CAD (coronary artery disease)     minimal plaque by cath 4/07: mLAD 30%, EF 60%    Current Outpatient Prescriptions  Medication Sig Dispense Refill  . Calcium Carbonate-Vit D-Min 600-200 MG-UNIT TABS Take 1 tablet by  mouth daily.        . fenofibrate (TRICOR) 145 MG tablet Take 145 mg by mouth every other day.       . levothyroxine (SYNTHROID, LEVOTHROID) 112 MCG tablet Take 112 mcg by mouth daily.        . multivitamin (THERAGRAN) per tablet Take 1 tablet by mouth daily.        Marland Kitchen omeprazole (PRILOSEC) 20 MG capsule TAKE ONE CAPSULE BY MOUTH ONE TIME DAILY  30 capsule  0  . metoprolol tartrate (LOPRESSOR) 25 MG tablet Take 1/2 (half) tablet twice daily when needed for palpitations  60 tablet  11    Allergies: Allergies  Allergen Reactions  . Codeine     Social history:  Nonsmoker  ROS:  See history of present illness.  Has had some leg pain.  She denies fevers, chills, cough, melena, hematochezia, dysuria, vomiting, diarrhea.  All other systems reviewed and negative.  Vital Signs: BP 110/68  Pulse 76  Ht 5\' 6"  (1.676 m)  Wt 167 lb (75.751 kg)  BMI 26.95 kg/m2  PHYSICAL EXAM: Well nourished, well developed, in no acute distress HEENT: normal Neck: no JVD Vascular: no carotid bruits Endocrine: Thyroidectomy scar noted; no thyroid palpated Cardiac:  normal S1, S2; RRR; no murmur Lungs:  clear to auscultation bilaterally, no wheezing, rhonchi or rales Abd: soft, nontender, no hepatomegaly Ext: no edema Skin: warm and dry Neuro:  CNs 2-12 intact, no focal abnormalities noted Psych: normal affect  EKG:  NSR, HR 76, no acute changes  ASSESSMENT AND PLAN:

## 2011-05-24 ENCOUNTER — Encounter: Payer: Self-pay | Admitting: Cardiology

## 2011-05-28 ENCOUNTER — Encounter: Payer: Self-pay | Admitting: Cardiology

## 2011-05-28 ENCOUNTER — Ambulatory Visit (INDEPENDENT_AMBULATORY_CARE_PROVIDER_SITE_OTHER): Payer: BC Managed Care – PPO | Admitting: Cardiology

## 2011-05-28 DIAGNOSIS — E785 Hyperlipidemia, unspecified: Secondary | ICD-10-CM

## 2011-05-28 DIAGNOSIS — I471 Supraventricular tachycardia: Secondary | ICD-10-CM

## 2011-06-12 NOTE — Assessment & Plan Note (Signed)
This is followed by Dr. Timothy Lasso.

## 2011-06-12 NOTE — Assessment & Plan Note (Signed)
Seems perhaps less at this point.  She thinks it was stress related, and perhaps some caffeine intake.  She will monitor.

## 2011-06-12 NOTE — Progress Notes (Signed)
HPI:  [name] is in for follow up.  She and I had a long discussion today about a number of issues.  She is having some conflict with her daughter, her son is getting ready to leave and go overseas, and she is worried about this.  She also has increasing difficulty related to issues with her dad, and I tried to explain to her some of those things today as she accompanies him to all of his office visits.  She is getting professional counseling for this.  Her issues with her daughter center around house sitting her dogs. She has had on occasion some palpitations that she notes.   She is followed closely by Dr. Timothy Lasso.     Current Outpatient Prescriptions  Medication Sig Dispense Refill  . Calcium Carbonate-Vit D-Min 600-200 MG-UNIT TABS Take 1 tablet by mouth daily.        . fenofibrate (TRICOR) 145 MG tablet Take 145 mg by mouth every other day.       . levothyroxine (SYNTHROID, LEVOTHROID) 112 MCG tablet Take 112 mcg by mouth daily.        . multivitamin (THERAGRAN) per tablet Take 1 tablet by mouth daily.        Marland Kitchen omeprazole (PRILOSEC) 20 MG capsule TAKE ONE CAPSULE BY MOUTH ONE TIME DAILY  30 capsule  0    Allergies  Allergen Reactions  . Codeine     Past Medical History  Diagnosis Date  . SVT (supraventricular tachycardia)     Hx of, s/p EP study with catheter ablation of AV nodal; re-entry tachycardia 01/10/06, s/p adenosine myoview, 10/06 showing an EF of 70% with no ischemia  . Hypothyroidism   . Hyperlipidemia   . Hyperglycemia     Borderline  . History of IBS   . Kidney stones   . Right thyroid nodule      s/p thyroid biopsy; report states follicular epitehlial cells, histiocytes, colloid, and lymphocytes (thyroiditis). Suggest a non-neoplastic process such as a hyperplastic nodule/ non-neoplastic goiter in a background of thyroiditis. Thyroid biopsy was done under a thyroid ultrasound at [facility].  Marland Kitchen CAD (coronary artery disease)     minimal plaque by cath 4/07: mLAD  30%, EF 60%    Past Surgical History  Procedure Date  . Biopsy thyroid   . Cesarean section     x2  . Laparoscopic cholecystectomy w/ cholangiography   . Total thyroidectomy 11/08/2007  . Tubal ligation     Family History  Problem Relation Age of Onset  . Coronary artery disease Mother 74  . Heart attack Father 40    MI x2  . Colon cancer Neg Hx     History   Social History  . Marital Status: Married    Spouse Name: N/A    Number of Children: N/A  . Years of Education: N/A   Occupational History  . Not employed outside the home    Social History Main Topics  . Smoking status: Never Smoker   . Smokeless tobacco: Not on file  . Alcohol Use: No  . Drug Use: No  . Sexually Active: Not on file   Other Topics Concern  . Not on file   Social History Narrative   [place] with husband2 adult childrenCares for her parents full timeFollows a regular dietNo routine exercise program    ROS: Please see the HPI.  All other systems reviewed and negative.  PHYSICAL EXAM:  BP 106/68  Pulse 83  Resp  18  Ht 5\' 6"  (1.676 m)  Wt 168 lb 12.8 oz (76.567 kg)  BMI 27.24 kg/m2  General: Well developed, well nourished, in no acute distress. Head:  Normocephalic and atraumatic. Neck: no JVD Lungs: Clear to auscultation and percussion. Heart: Normal S1 and S2.  No murmur, rubs or gallops.  Abdomen:  Normal bowel sounds; soft; non tender; no organomegaly Pulses: Pulses normal in all 4 extremities. Extremities: No clubbing or cyanosis. No edema. Neurologic: Alert and oriented x 3.  EKG:  None today.  ASSESSMENT AND PLAN:

## 2011-07-15 ENCOUNTER — Other Ambulatory Visit: Payer: Self-pay | Admitting: Obstetrics and Gynecology

## 2011-07-15 DIAGNOSIS — N644 Mastodynia: Secondary | ICD-10-CM

## 2011-07-21 LAB — DIFFERENTIAL
Basophils Absolute: 0.1
Basophils Relative: 1
Eosinophils Absolute: 0.3
Eosinophils Relative: 2
Lymphocytes Relative: 24
Lymphs Abs: 3.5
Monocytes Absolute: 1
Monocytes Relative: 7
Neutro Abs: 9.7 — ABNORMAL HIGH
Neutrophils Relative %: 66

## 2011-07-21 LAB — T4, FREE: Free T4: 1.48

## 2011-07-21 LAB — BASIC METABOLIC PANEL
CO2: 29
GFR calc Af Amer: >60
GFR calc non Af Amer: >60
Glucose, Bld: 113 — ABNORMAL HIGH
Potassium: 4.7
Sodium: 140

## 2011-07-21 LAB — CBC
HCT: 49.2 — ABNORMAL HIGH
Hemoglobin: 17.2 — ABNORMAL HIGH
MCHC: 35
MCV: 85.2
Platelets: 438 — ABNORMAL HIGH
RBC: 5.78 — ABNORMAL HIGH
RDW: 13.4
WBC: 14.6 — ABNORMAL HIGH

## 2011-07-21 LAB — MAGNESIUM: Magnesium: 2

## 2011-07-21 LAB — TSH: TSH: 0.739

## 2011-07-21 LAB — TROPONIN I: Troponin I: 0.04

## 2011-07-21 LAB — BASIC METABOLIC PANEL WITH GFR
BUN: 13
Calcium: 10
Chloride: 104
Creatinine, Ser: 0.9

## 2011-07-22 ENCOUNTER — Ambulatory Visit
Admission: RE | Admit: 2011-07-22 | Discharge: 2011-07-22 | Disposition: A | Discharge status: Home / Self Care | Payer: BC Managed Care – PPO | Source: Ambulatory Visit | Attending: Obstetrics and Gynecology | Admitting: Obstetrics and Gynecology

## 2011-07-22 DIAGNOSIS — N644 Mastodynia: Secondary | ICD-10-CM

## 2011-07-22 LAB — URINE MICROSCOPIC-ADD ON

## 2011-07-22 LAB — URINALYSIS, ROUTINE W REFLEX MICROSCOPIC
Bilirubin Urine: NEGATIVE
Nitrite: NEGATIVE
Specific Gravity, Urine: 1.023
Urobilinogen, UA: 0.2
pH: 5.5

## 2011-08-02 LAB — CBC
HCT: 45
Hemoglobin: 15.1 — ABNORMAL HIGH
MCHC: 33.6
RBC: 5.03
RDW: 13.3

## 2011-08-02 LAB — URINE MICROSCOPIC-ADD ON

## 2011-08-02 LAB — COMPREHENSIVE METABOLIC PANEL
ALT: 32
Alkaline Phosphatase: 44
BUN: 11
CO2: 26
Calcium: 9.7
GFR calc non Af Amer: >60
Glucose, Bld: 97
Total Protein: 7

## 2011-08-02 LAB — URINALYSIS, ROUTINE W REFLEX MICROSCOPIC
Hgb urine dipstick: NEGATIVE
Protein, ur: NEGATIVE
Specific Gravity, Urine: 1.024
Urobilinogen, UA: 0.2

## 2011-08-02 LAB — DIFFERENTIAL
Basophils Relative: 1
Eosinophils Absolute: 0.3
Lymphs Abs: 4.3 — ABNORMAL HIGH
Monocytes Relative: 9
Neutro Abs: 6.5
Neutrophils Relative %: 53

## 2011-08-06 LAB — DIFFERENTIAL
Basophils Absolute: 0
Lymphocytes Relative: 30
Lymphs Abs: 3.8
Monocytes Absolute: 0.9
Monocytes Relative: 8
Neutro Abs: 7.4

## 2011-08-06 LAB — COMPREHENSIVE METABOLIC PANEL
AST: 23
Albumin: 3.9
BUN: 13
Calcium: 9.3
Creatinine, Ser: 0.77
GFR calc Af Amer: >60
Total Bilirubin: 0.5
Total Protein: 6.8

## 2011-08-06 LAB — CBC
HCT: 45.4
MCHC: 34.3
MCV: 87.3
Platelets: 383
RDW: 13.4

## 2011-08-10 LAB — BASIC METABOLIC PANEL
Chloride: 105
GFR calc non Af Amer: >60
Glucose, Bld: 112 — ABNORMAL HIGH
Potassium: 3.9
Sodium: 137

## 2012-03-07 ENCOUNTER — Emergency Department (HOSPITAL_COMMUNITY)
Admission: EM | Admit: 2012-03-07 | Discharge: 2012-03-07 | Disposition: A | Discharge status: Home / Self Care | Payer: BC Managed Care – PPO | Attending: Emergency Medicine | Admitting: Emergency Medicine

## 2012-03-07 ENCOUNTER — Encounter (HOSPITAL_COMMUNITY): Payer: Self-pay

## 2012-03-07 DIAGNOSIS — K5289 Other specified noninfective gastroenteritis and colitis: Secondary | ICD-10-CM | POA: Insufficient documentation

## 2012-03-07 DIAGNOSIS — I2510 Atherosclerotic heart disease of native coronary artery without angina pectoris: Secondary | ICD-10-CM | POA: Insufficient documentation

## 2012-03-07 DIAGNOSIS — R109 Unspecified abdominal pain: Secondary | ICD-10-CM | POA: Insufficient documentation

## 2012-03-07 DIAGNOSIS — K529 Noninfective gastroenteritis and colitis, unspecified: Secondary | ICD-10-CM

## 2012-03-07 DIAGNOSIS — R1031 Right lower quadrant pain: Secondary | ICD-10-CM

## 2012-03-07 MED ORDER — METRONIDAZOLE 500 MG PO TABS: 500.0000 mg | ORAL_TABLET | Freq: Two times a day (BID) | ORAL | Status: AC

## 2012-03-07 MED ORDER — METRONIDAZOLE 500 MG PO TABS
500.0000 mg | ORAL_TABLET | Freq: Once | ORAL | Status: AC
  Administered 2012-03-07: 500 mg via ORAL
  Filled 2012-03-07: qty 1

## 2012-03-07 MED ORDER — CIPROFLOXACIN HCL 500 MG PO TABS: 500.0000 mg | ORAL_TABLET | Freq: Two times a day (BID) | ORAL | Status: AC

## 2012-03-07 MED ORDER — CIPROFLOXACIN HCL 500 MG PO TABS
500.0000 mg | ORAL_TABLET | Freq: Once | ORAL | Status: AC
  Administered 2012-03-07: 500 mg via ORAL
  Filled 2012-03-07: qty 1

## 2012-03-07 NOTE — ED Notes (Signed)
Patient states abdominal pain onset 5 days ago. States pain worsening overtime. Pain currently at rest 0/10 with palpation pain increases RLQ. Denies any urinary or bowel complaints. States slight intermittent nausea.  Airway intact bilateral equal chest rise and fall.

## 2012-03-07 NOTE — Consult Note (Signed)
Reason for Consult:Acute appendicitis Referring Physician: Dr Kateri Mc is an 56 y.o. female.  HPI: 56 year old Caucasian female who was sent to the emergency room by the radiologist at [facility] because of the diagnosis of acute appendicitis read on an outpatient CT scan of the abdomen and pelvis earlier today. The patient states that she developed intermittent colicky right lower quadrant pain last Thursday. The patient was still able to go about her daily activities which include caring for her father. She denies any prior symptoms. She states that she had similar discomfort when she had her gallbladder removed. She denies any anorexia. She has had some nausea. She describes the nausea is generally occurring after eating. She denies any diarrhea or constipation. She states that she has a history of IBS. She states that her stools are constantly semi-formed. She denies any melena or hematochezia. She denies any weight loss. She denies any stool caliber change. She states that she had a colonoscopy 3 years ago by Dr. Juanda Chance which was unremarkable. She denies any dysuria. She states that the pain worsen Sunday evening at night when she was trying to sleep. On Monday, the pain became more constant. She went to her primary care physician's office earlier today. Lab work was performed which revealed a white blood cell count of 12,000. Her other lab work was unremarkable. She was sent for a CT scan at [facility]. She states that initially the appendix is not visualized on the first CT so a second CT was performed. She states that the radiologist told her initially that her cecum appeared slightly abnormal. After leaving the facility on the way to get some food she was called by the radiologist and was told that it was more consistent with acute appendicitis and to go to the emergency room.  Past Medical History  Diagnosis Date  . SVT (supraventricular tachycardia)     Hx of, s/p EP study  with catheter ablation of AV nodal; re-entry tachycardia 01/10/06, s/p adenosine myoview, 10/06 showing an EF of 70% with no ischemia  . Hypothyroidism   . Hyperlipidemia   . Hyperglycemia     Borderline  . History of IBS   . Kidney stones   . Right thyroid nodule      s/p thyroid biopsy; report states follicular epitehlial cells, histiocytes, colloid, and lymphocytes (thyroiditis). Suggest a non-neoplastic process such as a hyperplastic nodule/ non-neoplastic goiter in a background of thyroiditis. Thyroid biopsy was done under a thyroid ultrasound at [facility].  Marland Kitchen CAD (coronary artery disease)     minimal plaque by cath 4/07: mLAD 30%, EF 60%    Past Surgical History  Procedure Date  . Biopsy thyroid   . Cesarean section     x2 via lower midline  . Laparoscopic cholecystectomy w/ cholangiography   . Total thyroidectomy 11/08/2007  . Tubal ligation     Family History  Problem Relation Age of Onset  . Coronary artery disease Mother 31  . Heart attack Father 41    MI x2  . Colon cancer Neg Hx     Social History:  reports that she has never smoked. She does not have any smokeless tobacco history on file. She reports that she does not drink alcohol or use illicit drugs.  Allergies:  Allergies  Allergen Reactions  . Codeine Nausea And Vomiting and Swelling    Medications: I have reviewed the patient's current medications.  No results found for this or any previous visit (  from the past 48 hour(s)).  No results found.  Review of Systems  Constitutional: Negative for fever, chills and weight loss.  HENT: Negative for hearing loss.   Eyes: Negative for blurred vision.  Respiratory: Negative for shortness of breath and wheezing.   Cardiovascular: Negative for chest pain, palpitations, orthopnea and PND.       Remote h/o svt s/p ablation  Gastrointestinal: Positive for nausea and abdominal pain. Negative for vomiting, constipation, blood in stool and melena.    Genitourinary: Negative for dysuria and urgency.  Musculoskeletal: Negative for joint pain.  Skin: Negative for itching and rash.  Neurological: Negative for seizures and loss of consciousness.  Psychiatric/Behavioral: Negative for substance abuse.   Blood pressure 107/74, pulse 71, temperature 98.3 F (36.8 C), temperature source Oral, resp. rate 22, SpO2 100.00%. Physical Exam  Vitals reviewed. Constitutional: She is oriented to person, place, and time. Vital signs are normal. She appears well-developed and well-nourished.  Non-toxic appearance. She does not have a sickly appearance. She does not appear ill. No distress.  HENT:  Head: Normocephalic and atraumatic.  Right Ear: External ear normal.  Left Ear: External ear normal.  Eyes: Conjunctivae are normal.  Neck: No tracheal deviation present.  Cardiovascular: Normal rate, regular rhythm, normal heart sounds and intact distal pulses.   Respiratory: Effort normal and breath sounds normal. No respiratory distress. She has no wheezes.  GI: Soft. Bowel sounds are normal. She exhibits no distension. There is tenderness (mild RLQ TTP). There is no rigidity, no rebound, no guarding and no CVA tenderness. No hernia.         Does not grimace when bed moved or rocked.   Musculoskeletal: Normal range of motion. She exhibits no edema and no tenderness.  Lymphadenopathy:    She has no cervical adenopathy.  Neurological: She is alert and oriented to person, place, and time. She exhibits normal muscle tone.  Skin: Skin is warm and dry. No rash noted. She is not diaphoretic.  Psychiatric: She has a normal mood and affect. Her behavior is normal. Judgment and thought content normal.   Data reviewed: outpt CT abd/pelvis  outpt labs  Assessment/Plan: 56 year old Caucasian female with 6 days of right lower quadrant pain more consistent with a mild colitis involving the cecum.  In my opinion the patient does not have acute appendicitis. I  personally reviewed her CT scan along with Dr. Kennith Center (radiologist). The patient's appendix on both CT scans appears normal. It is not dilated and, there is no periappendiceal stranding or inflammation. There is no perforation. There are normal fat planes around the appendix. After I left the reading room, the radiologist had another radiologist also review the images who also concurred that the appendix was normal. The patient does have a slight abnormality to the lateral wall of her cecum. It is slightly thickened. There is no pneumatosis. Again there is no sign of perforation. Clinically her story is not consistent with appendicitis. It is more consistent with a colitis of unknown etiology. I have discussed this with the patient and her husband.  Her primary care physician Dr. Timothy Lasso was in the emergency room and also discussed my findings and recommendations with him. I recommended antibiotics such as Cipro and Flagyl and a short interval followup with her gastroenterologist to consider a colonoscopy to rule out an underlying lesion. I also encouraged them to inform the patient to return if her symptoms worsen.  Mary Sella. Andrey Campanile, MD, FACS General, Bariatric, & Minimally Invasive  Surgery [facility], [place]   [facility] M 03/07/2012, 5:48 PM

## 2012-03-07 NOTE — ED Notes (Signed)
Surgery at bedside.

## 2012-03-07 NOTE — Discharge Instructions (Signed)
Colitis Colitis is inflammation of the colon. Colitis can be a short-term or long-standing (chronic) illness. Crohn's disease and ulcerative colitis are 2 types of colitis which are chronic. They usually require lifelong treatment. CAUSES  There are many different causes of colitis, including:  Viruses.   Germs (bacteria).   Medicine reactions.  SYMPTOMS   Diarrhea.   Intestinal bleeding.   Pain.   Fever.   Throwing up (vomiting).   Tiredness (fatigue).   Weight loss.   Bowel blockage.  DIAGNOSIS  The diagnosis of colitis is based on examination and stool or blood tests. X-rays, CT scan, and colonoscopy may also be needed. TREATMENT  Treatment may include:  Fluids given through the vein (intravenously).   Bowel rest (nothing to eat or drink for a period of time).   Medicine for pain and diarrhea.   Medicines (antibiotics) that kill germs.   Cortisone medicines.   Surgery.  HOME CARE INSTRUCTIONS   Get plenty of rest.   Drink enough water and fluids to keep your urine clear or pale yellow.   Eat a well-balanced diet.   Call your caregiver for follow-up as recommended.  SEEK IMMEDIATE MEDICAL CARE IF:   You develop chills.   You have an oral temperature above 102 F (38.9 C), not controlled by medicine.   You have extreme weakness, fainting, or dehydration.   You have repeated vomiting.   You develop severe belly (abdominal) pain or are passing bloody or tarry stools.  MAKE SURE YOU:   Understand these instructions.   Will watch your condition.   Will get help right away if you are not doing well or get worse.  Document Released: 11/25/2004 Document Revised: 10/07/2011 Document Reviewed: 02/20/2010 ExitCare Patient Information 2012 [employer]. 

## 2012-03-07 NOTE — ED Notes (Signed)
Rt. Side abdominal pain for 5 days, Went to [facility] and was diagnosed by CT scan Acute Appendicitis.

## 2012-03-07 NOTE — ED Provider Notes (Signed)
History     CSN: [id number]  Arrival date & time 03/07/12  1537   First MD Initiated Contact with Patient 03/07/12 1633      Chief Complaint  Patient presents with  . Abdominal Pain    (Consider location/radiation/quality/duration/timing/severity/associated sxs/prior treatment) HPI The patient presents with abdominal pain, nausea.  Notably, the patient was in her primary care physician's office earlier today, and had outpatient CT scan done prior to arrival.  She notes that her symptoms began gradually 5 days ago.  Since onset she has had intermittent pain focally about the right lower quadrant.  The pain is described as sharp, nonradiating.  The pain is worst postprandially, with increasing nausea postprandially as well.  She denies any ongoing fevers, chills, other abdominal pain, vomiting or diarrhea.  Over the past 24 hours the pain has increased, prompting her PMD visit.  There have been no clear alleviating factors, though as stated, symptoms are worse with by mouth intake. The patient has a history of IBS, is status post cholecystectomy, and has 2 C-sections in her past. Past Medical History  Diagnosis Date  . SVT (supraventricular tachycardia)     Hx of, s/p EP study with catheter ablation of AV nodal; re-entry tachycardia 01/10/06, s/p adenosine myoview, 10/06 showing an EF of 70% with no ischemia  . Hypothyroidism   . Hyperlipidemia   . Hyperglycemia     Borderline  . History of IBS   . Kidney stones   . Right thyroid nodule      s/p thyroid biopsy; report states follicular epitehlial cells, histiocytes, colloid, and lymphocytes (thyroiditis). Suggest a non-neoplastic process such as a hyperplastic nodule/ non-neoplastic goiter in a background of thyroiditis. Thyroid biopsy was done under a thyroid ultrasound at [facility].  Marland Kitchen CAD (coronary artery disease)     minimal plaque by cath 4/07: mLAD 30%, EF 60%    Past Surgical History  Procedure Date  . Biopsy  thyroid   . Cesarean section     x2  . Laparoscopic cholecystectomy w/ cholangiography   . Total thyroidectomy 11/08/2007  . Tubal ligation     Family History  Problem Relation Age of Onset  . Coronary artery disease Mother 75  . Heart attack Father 35    MI x2  . Colon cancer Neg Hx     History  Substance Use Topics  . Smoking status: Never Smoker   . Smokeless tobacco: Not on file  . Alcohol Use: No    OB History    Grav Para Term Preterm Abortions TAB SAB Ect Mult Living                  Review of Systems  Constitutional:       HPI  HENT:       HPI otherwise negative  Eyes: Negative.   Respiratory:       HPI, otherwise negative  Cardiovascular:       HPI, otherwise nmegative  Gastrointestinal: Negative for vomiting.  Genitourinary:       HPI, otherwise negative  Musculoskeletal:       HPI, otherwise negative  Skin: Negative.   Neurological: Negative for syncope.    Allergies  Codeine  Home Medications   Current Outpatient Rx  Name Route Sig Dispense Refill  . CALCIUM CARBONATE-VIT D-MIN 600-200 MG-UNIT PO TABS Oral Take 1 tablet by mouth daily.      . FENOFIBRATE 145 MG PO TABS Oral Take 145 mg by mouth every  other day.     Marland Kitchen LEVOTHYROXINE SODIUM 112 MCG PO TABS Oral Take 112 mcg by mouth daily.      . MULTIVITAMINS PO TABS Oral Take 1 tablet by mouth daily.      Marland Kitchen OMEPRAZOLE 20 MG PO CPDR Oral Take 20 mg by mouth daily.      BP 118/77  Pulse 88  Temp(Src) 98.3 F (36.8 C) (Oral)  Resp 22  SpO2 99%  Physical Exam  Nursing note and vitals reviewed. Constitutional: She is oriented to person, place, and time. She appears well-developed and well-nourished. No distress.  HENT:  Head: Normocephalic and atraumatic.  Eyes: Conjunctivae and EOM are normal.  Cardiovascular: Normal rate and regular rhythm.   Pulmonary/Chest: Effort normal and breath sounds normal. No stridor. No respiratory distress.  Abdominal: She exhibits no distension. There is  tenderness in the right lower quadrant. There is no rigidity, no rebound and no guarding.       Mild pain elicited in the abdomen with percussion of the heels  Musculoskeletal: She exhibits no edema.  Neurological: She is alert and oriented to person, place, and time. No cranial nerve deficit.  Skin: Skin is warm and dry.  Psychiatric: She has a normal mood and affect.    ED Course  Procedures (including critical care time)  Labs Reviewed - No data to display No results found.   No diagnosis found.   4:52 PM Patient notes that her pain is currently tolerable, defers analgesia.  Surgery has been paged.  5:47 PM I have discussed the case with both the surgeon on call and the patient's primary care physician.  Our radiologists have reviewed the CT scans, and cannot find any evidence of acute appendicitis.  There is suggestion of colitis.  MDM  This female with history of IBS, prior cholecystectomy now presents with 5 days of right lower quadrant pain and nausea.  The patient has no complaints of vomiting, diarrhea, fevers, chills.  Notably, the patient had outpatient CT scan performed which demonstrated acute appendicitis.  In review of the scan with our radiologist by the surgeon, was paged immediately after the patient's arrival, does not demonstrate acute appendicitis  The patient's findings are most consistent with colitis.  Given the absence of distress, abnormal vital signs, more than a mild leukocytosis, the patient is appropriate for continued management via outpatient physicians.  I discussed the case with the primary care physician as well as the surgeon at length.  The patient will be discharged with antibiotics to followup with her gastroenterologist.  Gerhard Munch, MD 03/07/12 [phone]

## 2012-03-08 ENCOUNTER — Telehealth: Payer: Self-pay | Admitting: Internal Medicine

## 2012-03-08 DIAGNOSIS — K760 Fatty (change of) liver, not elsewhere classified: Secondary | ICD-10-CM

## 2012-03-08 HISTORY — DX: Fatty (change of) liver, not elsewhere classified: K76.0

## 2012-03-08 NOTE — Telephone Encounter (Signed)
Spoke with patient and she was seen in ED yesterday after having a CT at [facility]. She was told by [facility] she had acute appendicitis and to go to ED. She reports she was seen by a surgeon and her PCP Dr. Timothy Lasso. They reviewed the CT and told her she did not have acute appendicitis but colitis. She was told to f/u with GI. Scheduled patient on 03/09/12 at 9:00 AM with Willette Cluster, NP. Called [facility] for report.

## 2012-03-09 ENCOUNTER — Ambulatory Visit (INDEPENDENT_AMBULATORY_CARE_PROVIDER_SITE_OTHER): Payer: BC Managed Care – PPO | Admitting: Nurse Practitioner

## 2012-03-09 ENCOUNTER — Encounter: Payer: Self-pay | Admitting: Internal Medicine

## 2012-03-09 ENCOUNTER — Encounter: Payer: Self-pay | Admitting: Nurse Practitioner

## 2012-03-09 VITALS — BP 118/70 | HR 67 | Ht 66.0 in | Wt 177.3 lb

## 2012-03-09 DIAGNOSIS — R933 Abnormal findings on diagnostic imaging of other parts of digestive tract: Secondary | ICD-10-CM

## 2012-03-09 DIAGNOSIS — R1031 Right lower quadrant pain: Secondary | ICD-10-CM

## 2012-03-09 MED ORDER — MOVIPREP 100 G PO SOLR: ORAL | Status: DC

## 2012-03-09 NOTE — Patient Instructions (Signed)
We scheduled the colonoscopy with Dr Juanda Chance for 04-18-2012. Directions given.  We sent a Moviprep prescription to [facility].

## 2012-03-09 NOTE — Progress Notes (Signed)
03/09/2012 [name] [id number] July 04, 1956   HISTORY OF PRESENT ILLNESS: Patient is a 56 year old female who was seen by Dr. Juanda Chance back in April 2010 for irritable bowel syndrome. She also has a history of GERD. Patient is here today for evaluation of RLQ pain. Labs at PCP's office 03/07/12 unremarkable except for a white count of 12.0. Urinalysis was normal. She had a CTscan at [facility] the same day and it suggested acute appendicitis. She was sent to the emergency department where she was seen by Dr. Andrey Campanile  (surgeon).  He reviewed the outpatient scan with radiology and conclusion was that patient did not have acute appendicitis, the appendix in fact looked normal. Overall  picture was more consistent with mild colitis involving the cecum. It was recommended she see gastroenterology. Patient was discharged home on Cipro and Flagyl.  She hasn't had any associated bowel changes. No fevers. She doesn't normally have RLQ with IBS. Her IBS symptoms include more diffuse lower abdominal discomfort and bloating, both of which usually only last for a day or so. Patient's weight is stable. She has an intermittent rash in right flank area. No joint aches.    Past Medical History  Diagnosis Date  . SVT (supraventricular tachycardia)     Hx of, s/p EP study with catheter ablation of AV nodal; re-entry tachycardia 01/10/06, s/p adenosine myoview, 10/06 showing an EF of 70% with no ischemia  . Hypothyroidism   . Hyperlipidemia   . Hyperglycemia     Borderline  . History of IBS   . Kidney stones   . Right thyroid nodule      s/p thyroid biopsy; report states follicular epitehlial cells, histiocytes, colloid, and lymphocytes (thyroiditis). Suggest a non-neoplastic process such as a hyperplastic nodule/ non-neoplastic goiter in a background of thyroiditis. Thyroid biopsy was done under a thyroid ultrasound at [facility].  Marland Kitchen CAD (coronary artery disease)     minimal plaque by cath 4/07: mLAD  30%, EF 60%   Past Surgical History  Procedure Date  . Biopsy thyroid   . Cesarean section     x2 via lower midline  . Laparoscopic cholecystectomy w/ cholangiography   . Total thyroidectomy 11/08/2007  . Tubal ligation     reports that she has never smoked. She has never used smokeless tobacco. She reports that she does not drink alcohol or use illicit drugs. family history includes Coronary artery disease (age of onset:60) in her mother and Heart attack (age of onset:60) in her father.  There is no history of Colon cancer. Allergies  Allergen Reactions  . Codeine Nausea And Vomiting and Swelling      Outpatient Encounter Prescriptions as of 03/09/2012  Medication Sig Dispense Refill  . Calcium Carbonate-Vit D-Min 600-200 MG-UNIT TABS Take 1 tablet by mouth daily.        . ciprofloxacin (CIPRO) 500 MG tablet Take 1 tablet (500 mg total) by mouth 2 (two) times daily.  20 tablet  0  . fenofibrate (TRICOR) 145 MG tablet Take 145 mg by mouth every other day.       . levothyroxine (SYNTHROID, LEVOTHROID) 112 MCG tablet Take 112 mcg by mouth daily.        . metroNIDAZOLE (FLAGYL) 500 MG tablet Take 1 tablet (500 mg total) by mouth 2 (two) times daily.  20 tablet  0  . multivitamin (THERAGRAN) per tablet Take 1 tablet by mouth daily.        Marland Kitchen omeprazole (PRILOSEC) 20 MG capsule Take  20 mg by mouth daily.        REVIEW OF SYSTEMS  : Positive for allergy problems, night sweats, skin rash and sleeping problems. All other systems reviewed and negative except where noted in the History of Present Illness.   PHYSICAL EXAM: BP 118/70  Pulse 67  Ht 5\' 6"  (1.676 m)  Wt 177 lb 4.8 oz (80.423 kg)  BMI 28.62 kg/m2  SpO2 98% General: Well developed white female in no acute distress Head: Normocephalic and atraumatic Eyes:  sclerae anicteric,conjunctive pink. Ears: Normal auditory acuity Mouth: No deformity or lesions Neck: Supple, no masses.  Lungs: Clear throughout to auscultation Heart:  Regular rate and rhythm Abdomen: Soft, non distended, mild-moderate RLQ tenderness. No masses or hepatomegaly noted. Normal bowel sounds Rectal: not done Musculoskeletal: Symmetrical with no gross deformities  Skin: No lesions on visible extremities Extremities: No edema or deformities noted Neurological: Alert oriented, grossly nonfocal Cervical Nodes:  No significant cervical adenopathy Psychological:  Alert and cooperative. Normal mood and affect  ASSESSMENT AND PLAN: 1.  Acute RLQ pain, mild leukocytosis and CTscan ([facility] 03/07/12) suggesting acute appendicitis. [facility]. It was felt patient did not have acute appendicitis but rather colitis involving the cecum. She is feeling somewhat better on Cipro and Flagyl. Patient declines pain medication. Would expect her to have associated bowel changes if this were infectious colitis. Inflammatory bowel disease is the list of differential diagnoses.  Patient had a normal complete colonoscopy three years ago but given this unusual presentation, repeat colonoscopy is probably prudent. The risks, benefits, and alternatives to colonoscopy with possible biopsy and possible polypectomy were discussed with the patient and she consents to proceed.  Patient will finish course of antibiotics and have the colonoscopy done in approximately 3 weeks  2. IBS, diagnosed approximately 3 years ago. Her irritable bowel symptoms usually consist of bloating and diffuse lower abdominal discomfort, both lasting half a day or less.  3. hypothyroidism, on Synthroid.

## 2012-03-10 ENCOUNTER — Telehealth: Payer: Self-pay | Admitting: Internal Medicine

## 2012-03-10 NOTE — Telephone Encounter (Signed)
Pt called to see if Dr. Juanda Chance had reviewed her CT scan that was done in the ER. Per pt Willette Cluster NP told her Dr. Juanda Chance would be in the office to review the scan today. Let pt know Dr. Juanda Chance will not be back until next week.

## 2012-03-10 NOTE — Progress Notes (Signed)
I am confused about this patient after reading her emergency room visit and x-ray reports. It certainly sound like she had subacute appendicitis with leukocytosis, right lower quadrant pain, and abnormal CT scan. Apparently she had a surgical consult. She currently is on antibiotics but does need further imaging such as colonoscopy. It is certainly possible that this patient has underlying inflammatory bowel disease. I would be sure to send all nodes on this patient concerning her case to her surgeon that saw her in the emergency room.

## 2012-03-16 ENCOUNTER — Telehealth: Payer: Self-pay | Admitting: Internal Medicine

## 2012-03-16 NOTE — Telephone Encounter (Signed)
Patient wants to know if Dr. Juanda Chance agrees with Willette Cluster, NP that she needs a colonoscopy based on CT done at [facility] and OV on 03/09/12.(CT disc is on MD's desk if needed to review) Please, advise.

## 2012-03-16 NOTE — Telephone Encounter (Signed)
I have spoken to the patient, she is completely well, no more pain or any other symptoms. I gave her 3 options: do nothing, repeat CT scan or do colonoscopy. She wants to do nothing. She is aware of uncertainty of the diagnosis. Please cancel her colonoscopy  And remove it from the schedule so that we can use her time for another patient. Thanx DB

## 2012-03-16 NOTE — Telephone Encounter (Signed)
Cancelled Colonoscopy

## 2012-04-18 ENCOUNTER — Encounter: Payer: BC Managed Care – PPO | Admitting: Internal Medicine

## 2012-05-29 ENCOUNTER — Encounter: Payer: Self-pay | Admitting: Cardiology

## 2012-05-29 ENCOUNTER — Ambulatory Visit (INDEPENDENT_AMBULATORY_CARE_PROVIDER_SITE_OTHER): Payer: BC Managed Care – PPO | Admitting: Cardiology

## 2012-05-29 VITALS — BP 112/80 | HR 76 | Ht 66.0 in | Wt 177.0 lb

## 2012-05-29 DIAGNOSIS — I471 Supraventricular tachycardia, unspecified: Secondary | ICD-10-CM

## 2012-05-29 DIAGNOSIS — R079 Chest pain, unspecified: Secondary | ICD-10-CM

## 2012-05-29 NOTE — Patient Instructions (Addendum)
Your physician recommends that you schedule a follow-up appointment as needed.   Your physician recommends that you continue on your current medications as directed. Please refer to the Current Medication list given to you today.  

## 2012-05-29 NOTE — Progress Notes (Signed)
HPI:  Patient is in for followup. She's not had any recurrent arrhythmias. She denies any other cardiac symptoms at the present time. Since I last saw her, she was in the emergency room with what turned out to be colitis. There is some speculation that she might have appendicitis, but that turned out not to be true. She's done well since that time.  Current Outpatient Prescriptions  Medication Sig Dispense Refill  . aspirin 81 MG tablet Take 81 mg by mouth every other day.      . Calcium Carbonate-Vit D-Min 600-200 MG-UNIT TABS Take 1 tablet by mouth daily.        . fenofibrate (TRICOR) 145 MG tablet Take 145 mg by mouth every other day.       . levothyroxine (SYNTHROID, LEVOTHROID) 112 MCG tablet Take 112 mcg by mouth daily.        . multivitamin (THERAGRAN) per tablet Take 1 tablet by mouth daily.        Marland Kitchen omeprazole (PRILOSEC) 20 MG capsule Take 20 mg by mouth daily.        Allergies  Allergen Reactions  . Codeine Nausea And Vomiting and Swelling    Past Medical History  Diagnosis Date  . SVT (supraventricular tachycardia)     Hx of, s/p EP study with catheter ablation of AV nodal; re-entry tachycardia 01/10/06, s/p adenosine myoview, 10/06 showing an EF of 70% with no ischemia  . Hypothyroidism   . Hyperlipidemia   . Hyperglycemia     Borderline  . History of IBS   . Kidney stones   . Right thyroid nodule      s/p thyroid biopsy; report states follicular epitehlial cells, histiocytes, colloid, and lymphocytes (thyroiditis). Suggest a non-neoplastic process such as a hyperplastic nodule/ non-neoplastic goiter in a background of thyroiditis. Thyroid biopsy was done under a thyroid ultrasound at [facility].  Marland Kitchen CAD (coronary artery disease)     minimal plaque by cath 4/07: mLAD 30%, EF 60%    Past Surgical History  Procedure Date  . Biopsy thyroid   . Cesarean section     x2 via lower midline  . Laparoscopic cholecystectomy w/ cholangiography   . Total  thyroidectomy 11/08/2007  . Tubal ligation     Family History  Problem Relation Age of Onset  . Coronary artery disease Mother 12  . Heart attack Father 65    MI x2  . Colon cancer Neg Hx     History   Social History  . Marital Status: Married    Spouse Name: N/A    Number of Children: N/A  . Years of Education: N/A   Occupational History  . Not employed outside the home    Social History Main Topics  . Smoking status: Never Smoker   . Smokeless tobacco: Never Used  . Alcohol Use: No  . Drug Use: No  . Sexually Active: Not on file   Other Topics Concern  . Not on file   Social History Narrative   [place] with husband2 adult childrenCares for her parents full timeFollows a regular dietNo routine exercise program    ROS: Please see the HPI.  All other systems reviewed and negative.  PHYSICAL EXAM:  BP 112/80  Pulse 76  Ht 5\' 6"  (1.676 m)  Wt 177 lb (80.287 kg)  BMI 28.57 kg/m2  General: Well developed, well nourished, in no acute distress. Head:  Normocephalic and atraumatic. Neck: no JVD Lungs: Clear to auscultation and  percussion. Heart: Normal S1 and S2.  No murmur, rubs or gallops.  Extremities: No clubbing or cyanosis. No edema. Neurologic: Alert and oriented x 3.  EKG:  NSR.  WNL.  CATH 2007   ANGIOGRAPHIC DATA:  1. Aortic root aortography reveals no evidence of aortic dissection. The  aortic leaflets appear to move well.  2. Ventriculography was done in the RAO projection. Overall LV function  was vigorous with ejection fraction appearing to be in excess of 60%.  There was ventricular ectopy to the pigtail catheter.  3. The left main is very short and essentially there are 2 ostia for the  LAD and the circumflex.  4. Left anterior descending artery courses to the apex. There is a dense  area of calcification in the mid vessel just after the takeoff of the  diagonal branch. There is eccentric plaque at this location measuring    about 30% luminal reduction. Critical stenoses is not noted in any  view. There is a second diagonal branch in the distal LAD and is free  of critical disease.  5. The circumflex provides a major marginal branch, than a smaller  posterolateral branch. The circumflex is relatively smooth throughout  without significant focal narrowing.  6. The right coronary is a large-caliber vessel. The RCA may have a little  bit of ostial spasm, but there is no damping of the catheter in the RCA.  The vessel appears to be smooth throughout providing a posterior  descending and then a bifurcating posterolateral branch all of which  appear to be free of critical disease.  CONCLUSIONS:  1. Well-preserved overall left ventricular function.  2. No evidence of aortic dissection.  3. Calcification of the mid left anterior descending artery with mild 30%  eccentric plaquing.  4. No critical obstruction of the circumflex or right coronary artery.    ASSESSMENT AND PLAN:

## 2012-05-29 NOTE — Assessment & Plan Note (Signed)
Stable.  No recurrence.  Follow up prn.

## 2012-06-04 NOTE — Assessment & Plan Note (Signed)
Should be noted that patient had cath in 2007, and some calcification mid LAD territory.  Has history of PCO and was managed previously by Dr. Dagoberto Ligas.  Recent CT of abdomen showed fatty liver.  Would defer any metabolic treatment strategy to Dr. Timothy Lasso.

## 2012-11-02 ENCOUNTER — Other Ambulatory Visit: Payer: Self-pay | Admitting: Obstetrics and Gynecology

## 2012-11-02 DIAGNOSIS — N644 Mastodynia: Secondary | ICD-10-CM

## 2012-11-06 ENCOUNTER — Ambulatory Visit
Admission: RE | Admit: 2012-11-06 | Discharge: 2012-11-06 | Disposition: A | Discharge status: Home / Self Care | Payer: BC Managed Care – PPO | Source: Ambulatory Visit | Attending: Obstetrics and Gynecology | Admitting: Obstetrics and Gynecology

## 2012-11-06 DIAGNOSIS — N644 Mastodynia: Secondary | ICD-10-CM

## 2012-12-04 ENCOUNTER — Telehealth: Payer: Self-pay | Admitting: Internal Medicine

## 2012-12-04 NOTE — Telephone Encounter (Signed)
Spoke with patient and for the last 5 days, she has had nausea, abdominal pain above belly button under her breasts, bloating and multiple soft stools. Symptoms are worse in AM and get better as the day goes on. She is taking Prilosec daily. Scheduled patient with Willette Cluster, NP; on 12/05/12 at 10:30 AM.

## 2012-12-05 ENCOUNTER — Ambulatory Visit (INDEPENDENT_AMBULATORY_CARE_PROVIDER_SITE_OTHER): Payer: BC Managed Care – PPO | Admitting: Nurse Practitioner

## 2012-12-05 ENCOUNTER — Encounter: Payer: Self-pay | Admitting: Nurse Practitioner

## 2012-12-05 VITALS — BP 100/70 | HR 88 | Ht 66.0 in | Wt 178.0 lb

## 2012-12-05 DIAGNOSIS — K589 Irritable bowel syndrome without diarrhea: Secondary | ICD-10-CM

## 2012-12-05 MED ORDER — HYOSCYAMINE SULFATE 0.125 MG SL SUBL: 0.1250 mg | SUBLINGUAL_TABLET | SUBLINGUAL | Status: DC | PRN

## 2012-12-05 MED ORDER — LORAZEPAM 0.5 MG PO TABS: ORAL_TABLET | ORAL | Status: DC

## 2012-12-05 NOTE — Patient Instructions (Addendum)
You have been provided a prescription of Ativan, and I have sent a prescription for Levsin to your pharmacy  Please call back if your symptoms fail to improve

## 2012-12-05 NOTE — Progress Notes (Signed)
12/05/2012 [name] [id number] 10-07-1956   History of Present Illness:  Patient is a 57 year old female known to Dr. Juanda Chance. She gives a 15 year history of IBS. Flares described as abdominal pain associated with increased number of soft stools. Pain relieved with defecation. Episodes usually last a few days at a time, occur maybe once a month. In between episodes she feels fine. No fevers. She does have occasional rectal bleeding but attributes that to hemorrhoids. Colonoscopy in March 2010 was normal (though terminal ileum not intubated). Random colon biopsies were normal.   I saw patient in May 2013 after she had been to ED for RLQ. CTscan at [facility] suggested acute appendicitis. Patient was seen by surgery (Dr Andrey Campanile) who felt she had coliis involving cecum instead. Patient was treated with antibiotics and given appointment with [place]. I scheduled her for a colonoscopy for further evaluation but patient called the office, spoke with Dr. Juanda Chance and colonoscopy was cancelled since patient's symptoms resolved. She hasn't had any recurrent RLQ pain.    Patient worked in for IBS flare. This flare is lasting longer than usual, today is her 5th day. Patient does admit though  that she is slighlty better today. Her symptoms are same as always with exception of some upper abdominal discomfort in addition to her usual lower abdominal discomfort.    Patient sees definite relationship between stress and IBS flares. She is taking care of her father now and that is associated with some increased stress. On rare occasions she takes 1/2 xanax but is scared to take these type meds.   Patient gives a 7-8 year history GERD for which she takes daily Prilosec. She has been bothered by heartburn the last couple of days. No SOB. Getting on treadmill helps helps the chest discomfort. She avoids fried food and spicy food.   Current Medications, Allergies, Past Medical History, Past Surgical History, Family History  and Social History were reviewed in [employer] record.   Physical Exam: General: Well developed , white female in no acute distress Head: Normocephalic and atraumatic Eyes:  sclerae anicteric, conjunctiva pink  Ears: Normal auditory acuity Lungs: Clear throughout to auscultation Heart: Regular rate and rhythm Abdomen: Soft,  distended. No masses, no hepatomegaly. Normal bowel sounds Rectal: A few external hemorrhoidal tags. Very small internal hemorrhoids on anoscopy Musculoskeletal: Symmetrical with no gross deformities  Extremities: No edema  Neurological: Alert oriented x 4, grossly nonfocal Psychological:  Alert and cooperative. Normal mood and affect  Assessment and Recommendations:   irritable bowel syndrome characterized by abdominal pain and increased number of soft bowel movements. Her IBS symptoms are better today, will see what tomorrow brings.   Trial of Levsin SL prn.    She inquires about crohn's. Reassurance given. Nothing to suggest crohn's disease at this point  Very small dose of Ativan which she will use judiciously to see if it helps during "flares'

## 2012-12-05 NOTE — Progress Notes (Signed)
Reviewed and agree, IBS flare up.

## 2012-12-16 ENCOUNTER — Other Ambulatory Visit: Payer: Self-pay

## 2013-03-01 ENCOUNTER — Other Ambulatory Visit: Payer: Self-pay | Admitting: Obstetrics and Gynecology

## 2013-05-08 ENCOUNTER — Other Ambulatory Visit (INDEPENDENT_AMBULATORY_CARE_PROVIDER_SITE_OTHER): Payer: BC Managed Care – PPO

## 2013-05-08 ENCOUNTER — Encounter: Payer: Self-pay | Admitting: *Deleted

## 2013-05-08 ENCOUNTER — Ambulatory Visit (INDEPENDENT_AMBULATORY_CARE_PROVIDER_SITE_OTHER): Payer: BC Managed Care – PPO | Admitting: Internal Medicine

## 2013-05-08 ENCOUNTER — Telehealth: Payer: Self-pay | Admitting: Internal Medicine

## 2013-05-08 VITALS — BP 110/70 | HR 80 | Ht 66.0 in | Wt 176.5 lb

## 2013-05-08 DIAGNOSIS — R195 Other fecal abnormalities: Secondary | ICD-10-CM

## 2013-05-08 DIAGNOSIS — K219 Gastro-esophageal reflux disease without esophagitis: Secondary | ICD-10-CM

## 2013-05-08 DIAGNOSIS — R1032 Left lower quadrant pain: Secondary | ICD-10-CM

## 2013-05-08 LAB — CBC WITH DIFFERENTIAL/PLATELET
Basophils Absolute: 0.1 10*3/uL (ref 0.0–0.1)
Eosinophils Absolute: 0.3 10*3/uL (ref 0.0–0.7)
HCT: 44.2 % (ref 36.0–46.0)
Lymphs Abs: 3.9 10*3/uL (ref 0.7–4.0)
MCHC: 34.8 g/dL (ref 30.0–36.0)
Monocytes Relative: 8.7 % (ref 3.0–12.0)
Platelets: 337 10*3/uL (ref 150.0–400.0)
RDW: 13.7 % (ref 11.5–14.6)

## 2013-05-08 LAB — COMPREHENSIVE METABOLIC PANEL
ALT: 50 U/L — ABNORMAL HIGH (ref 0–35)
AST: 37 U/L (ref 0–37)
Alkaline Phosphatase: 54 U/L (ref 39–117)
CO2: 26 mEq/L (ref 19–32)
GFR: 60.58 mL/min (ref >60.00)
Sodium: 143 mEq/L (ref 135–145)
Total Bilirubin: 0.6 mg/dL (ref 0.3–1.2)
Total Protein: 7.9 g/dL (ref 6.0–8.3)

## 2013-05-08 MED ORDER — OMEPRAZOLE 20 MG PO CPDR: 20.0000 mg | DELAYED_RELEASE_CAPSULE | Freq: Two times a day (BID) | ORAL | Status: DC

## 2013-05-08 MED ORDER — CIPROFLOXACIN HCL 500 MG PO TABS: 500.0000 mg | ORAL_TABLET | Freq: Two times a day (BID) | ORAL | Status: DC

## 2013-05-08 NOTE — Patient Instructions (Addendum)
We have sent the following medications to your pharmacy for you to pick up at your convenience: Cipro Prilosec 20 mg twice daily  Your physician has requested that you go to the basement for the following lab work before leaving today: CBC, CMET, sed rate  You have been scheduled for a CT scan of the abdomen and pelvis at [facility] ([address]---this is in the same building as [facility]).   You are scheduled on 05/09/13 at 9:00 am. You should arrive 15 minutes prior to your appointment time for registration. Please follow the written instructions below on the day of your exam:  WARNING: IF YOU ARE ALLERGIC TO IODINE/X-RAY DYE, PLEASE NOTIFY RADIOLOGY IMMEDIATELY AT [phone]! YOU WILL BE GIVEN A 13 HOUR PREMEDICATION PREP.  1) Do not eat or drink anything after 5:00 am (4 hours prior to your test) 2) You have been given 2 bottles of oral contrast to drink. The solution may taste better if refrigerated, but do NOT add ice or any other liquid to this solution. Shake well before drinking.    Drink 1 bottle of contrast @ 7:00 am (2 hours prior to your exam)  Drink 1 bottle of contrast @ 8:00 am (1 hour prior to your exam)  You may take any medications as prescribed with a small amount of water except for the following: Metformin, Glucophage, Glucovance, Avandamet, Riomet, Fortamet, Actoplus Met, Janumet, Glumetza or Metaglip. The above medications must be held the day of the exam AND 48 hours after the exam.  The purpose of you drinking the oral contrast is to aid in the visualization of your intestinal tract. The contrast solution may cause some diarrhea. Before your exam is started, you will be given a small amount of fluid to drink. Depending on your individual set of symptoms, you may also receive an intravenous injection of x-ray contrast/dye. Plan on being at [facility] for 30 minutes or long, depending on the type of exam you are having performed.  This  test typically takes 30-45 minutes to complete.  If you have any questions regarding your exam or if you need to reschedule, you may call the CT department at [phone] between the hours of 8:00 am and 5:00 pm, Monday-Friday.  ________________________________________________________________________ Please follow a full liquid diet x 24 hours  Full Liquid Diet The full liquid diet includes fluids and foods that are liquid, or will become liquid, at room temperature. Ice cream, gelatin dessert, tea, juice, frozen ice pops, and pudding are some examples of foods that can be eaten on a full liquid diet. You cannot eat solid foods on this diet. A person should only be on this diet for a short amount of time. If this diet is to be used for more than 7 days, you may need to take a multivitamin or a nutritional supplement. REASONS FOR USE  It may be used as a transition diet between the clear liquid diet and soft diet.  It may be used when a person cannot tolerate solid foods.  It may be used before or after certain procedures, tests, or surgeries.  It may be used when a person has trouble swallowing or chewing. CHOOSING FOODS Breads and Starches  Allowed: None are allowed except crackers that are pureed (made into a thick, smooth soup) in soup. Potatoes, pasta, and rice are only allowed if they are pureed in soup. Cooked, refined corn, oat, rice, rye, and wheat cereals are also allowed.  Avoid: Any  others. Vegetables  Allowed: Strained tomato or vegetable juice. Vegetables pureed in soup.  Avoid: Any others. Fruit  Allowed: Any strained fruit juices and fruit drinks. Include 1 serving of citrus or vitamin C-enriched fruit juice daily.  Avoid: Any others. Protein  Allowed: Eggs in custard, eggnog mix, and eggs used in ice cream or pudding.  Avoid: Any meat, fish, or fowl. All other cooked or raw eggs. Dairy  Allowed: Milk and milk-based beverages, including milk shakes and instant  breakfast mixes. Smooth yogurt. Pureed cottage cheese.  Avoid: Any others. All other cheese. Avoid dairy products if not tolerated. Soups and Combination Foods  Allowed: Broth and strained cream soups. Strained, broth-based soups.  Avoid: Any others. Desserts and Sweets  Allowed: Custard, flavored gelatin, tapioca, plain ice cream, sherbet, smooth pudding, junket, fruit ices, frozen ice pops, and pudding pops. Other frozen bars with cream, frozen fudge pops, and chocolate syrup. Sugar, honey, jelly, and syrup.  Avoid: Any others. Fats and Oils  Allowed: Margarine, butter, cream, sour cream, and oils.  Avoid: Any others. Beverages  Allowed: All.  Avoid: None. Condiments  Allowed: Iodized salt, pepper, spices, and flavorings. Cocoa powder.  Avoid: Any others. SAMPLE MEAL PLAN The following sample meal plan cannot meet the recommended dietary allowances of the [employer] without appropriate supplementation under the guidance of your caregiver or dietitian. Breakfast   cup orange juice.  1 cup cooked wheat cereal.  1 cup milk.  1 cup beverage (coffee or tea).  Cream or sugar, if desired. Midmorning Snack  1 cup pasteurized eggnog (made from powdered eggs mixed with milk, not raw eggs). Lunch  1 cup cream soup.   cup fruit juice.  1 cup milk.   cup custard.  1 cup beverage (coffee or tea).  Cream or sugar, if desired. Midafternoon Snack  1 cup milk shake. Dinner  1 cup cream soup.   cup fruit juice.  1 cup milk.   cup pudding.  1 cup beverage (coffee or tea).  Cream or sugar, if desired. Evening Snack  1 cup supplement. To increase calories, add sugar, cream, butter, or margarine if possible. Nutritional supplements will also increase the total calories. Document Released: 10/18/2005 Document Revised: 04/18/2012 Document Reviewed: 01/18/2012 [facility] Patient Information 2014 [place], [place].  Dr Creola Corn

## 2013-05-08 NOTE — Progress Notes (Signed)
[name] 1956-04-18 MRN [id number]  History of Present Illness:  This is a 57 year old white female with acute left lower and middle quadrant abdominal pain which woke her up at 1:30 in the morning 2 days ago and extended to left upper quadrant. She had several bowel movements but no blood. At the same time, she developed increased gastroesophageal reflux which is normally under good control with Prilosec 20 mg in the morning. She denies eating any unusual food or taking any over-the-counter antiinflammatory medications. She had a sudden episode of acute right lower quadrant abdominal pain in May 2013 which was suggestive of appendicitis on a CT scan of the abdomen which at that time showed inflammatory changes in the right lower quadrant . She was treated conservatively with antibiotics with complete resolution. There is a hx of Crohn's disease in the maternal cousin. She had a normal colonoscopy in March 2010. Her biopsies were negative for colitis ( TI was not examined). She has been diagnosed with irritable bowel syndrome.   Past Medical History  Diagnosis Date  . SVT (supraventricular tachycardia)     Hx of, s/p EP study with catheter ablation of AV nodal; re-entry tachycardia 01/10/06, s/p adenosine myoview, 10/06 showing an EF of 70% with no ischemia  . Hypothyroidism   . Hyperlipidemia   . Hyperglycemia     Borderline  . History of IBS   . Kidney stones   . Right thyroid nodule      s/p thyroid biopsy; report states follicular epitehlial cells, histiocytes, colloid, and lymphocytes (thyroiditis). Suggest a non-neoplastic process such as a hyperplastic nodule/ non-neoplastic goiter in a background of thyroiditis. Thyroid biopsy was done under a thyroid ultrasound at [facility].  Marland Kitchen CAD (coronary artery disease)     minimal plaque by cath 4/07: mLAD 30%, EF 60%  . IBS (irritable bowel syndrome)   . Hiatal hernia   . Hepatic steatosis 03/08/12   Past Surgical History   Procedure Laterality Date  . Biopsy thyroid    . Cesarean section      x2 via lower midline  . Laparoscopic cholecystectomy w/ cholangiography    . Total thyroidectomy  11/08/2007  . Tubal ligation      reports that she has never smoked. She has never used smokeless tobacco. She reports that she does not drink alcohol or use illicit drugs. family history includes Coronary artery disease (age of onset: 42) in her mother and Heart attack (age of onset: 20) in her father.  There is no history of Colon cancer. Allergies  Allergen Reactions  . Codeine Nausea And Vomiting and Swelling        Review of Systems: Increased heartburn. Denies dysphagia. Denies fever denies rectal bleeding  The remainder of the 10 point ROS is negative except as outlined in H&P   Physical Exam: General appearance  Well developed, in no distress. Eyes- non icteric. HEENT nontraumatic, normocephalic. Mouth no lesions, tongue papillated, no cheilosis. Neck supple without adenopathy, thyroid not enlarged, no carotid bruits, no JVD. Lungs Clear to auscultation bilaterally. Cor normal S1, normal S2, regular rhythm, no murmur,  quiet precordium. Abdomen: Mildly obese tender in epigastrium, left upper quadrant left middle quadrant and left lower quadrant. There is no rebound. Bowel sounds are normal active. Rectal: Small external hemorrhoidal tag. Normal rectal sphincter tone. Small amount of brown Hemoccult-positive stool. Extremities no pedal edema. Skin no lesions. Neurological alert and oriented x 3. Psychological normal mood and affect.  Assessment and Plan:  Problem #25 57 year old white female with recurrent episodes of abdominal pain which last presented in the right lower quadrant and this time is localized to the left lower and middle quadrants. It is suggestive of acute colitis. There is a strong family history of Crohn's disease. We will go ahead with a repeat CT scan of the abdomen and compare it  with the May 2013 CT scan. We will increase her Prilosec to 20 mg twice a day and start empirically Cipro 500 mg by mouth twice a day. We need to rule out the possibility of diverticulitis. She will stay on full liquids for the next 24 hours and we will be checking her CBC, metabolic panels and sedimentation rate.   05/08/2013 [name]

## 2013-05-08 NOTE — Telephone Encounter (Signed)
Patient states for the last month, she has had left upper quad pain. Pain is under the ribs to lower back. On Saturday around 1 AM, she woke up with the left side abdominal pain and acid reflux. She called her PCP yesterday and was told to "monitor it." She reports also having lots of gas, burping and nausea. She is taking her Omeprazole daily and will increase this to BID. She is very concerned that the "colitis I had years ago is back and causing the pain." She does have a hx of IBS but she feels this pain is not like the IBS pain. Scheduled with Dr. Juanda Chance today at 3:15PM.

## 2013-05-09 ENCOUNTER — Ambulatory Visit (INDEPENDENT_AMBULATORY_CARE_PROVIDER_SITE_OTHER)
Admission: RE | Admit: 2013-05-09 | Discharge: 2013-05-09 | Disposition: A | Discharge status: Home / Self Care | Payer: BC Managed Care – PPO | Source: Ambulatory Visit | Attending: Internal Medicine | Admitting: Internal Medicine

## 2013-05-09 ENCOUNTER — Telehealth: Payer: Self-pay | Admitting: *Deleted

## 2013-05-09 DIAGNOSIS — R1032 Left lower quadrant pain: Secondary | ICD-10-CM

## 2013-05-09 MED ORDER — METRONIDAZOLE 250 MG PO TABS: ORAL_TABLET | ORAL | Status: DC

## 2013-05-09 MED ORDER — IOHEXOL 300 MG/ML  SOLN
100.0000 mL | Freq: Once | INTRAMUSCULAR | Status: AC | PRN
  Administered 2013-05-09: 100 mL via INTRAVENOUS

## 2013-05-09 NOTE — Telephone Encounter (Signed)
I have spoken to the pt, she is feeling much better. I will see her in the office after she finishes Cipro and Flagyl and then she will likely need a colonoscopy

## 2013-05-09 NOTE — Telephone Encounter (Signed)
Call report on CT- suspicious for uncomplicated appendicitis. Please, advise.

## 2013-05-09 NOTE — Telephone Encounter (Signed)
Per Dr. Juanda Chance, add Flagyl 250 mg TID x 1 week. OV with Dr. Juanda Chance after finishes ABX. Rx sent. Scheduled patient on 05/22/13 at 2:00 PM. Patient aware.

## 2013-05-10 ENCOUNTER — Encounter: Payer: Self-pay | Admitting: *Deleted

## 2013-05-22 ENCOUNTER — Encounter: Payer: Self-pay | Admitting: Internal Medicine

## 2013-05-22 ENCOUNTER — Ambulatory Visit (INDEPENDENT_AMBULATORY_CARE_PROVIDER_SITE_OTHER): Payer: BC Managed Care – PPO | Admitting: Internal Medicine

## 2013-05-22 VITALS — BP 100/72 | HR 84 | Ht 65.0 in | Wt 178.2 lb

## 2013-05-22 DIAGNOSIS — R933 Abnormal findings on diagnostic imaging of other parts of digestive tract: Secondary | ICD-10-CM

## 2013-05-22 DIAGNOSIS — K5289 Other specified noninfective gastroenteritis and colitis: Secondary | ICD-10-CM

## 2013-05-22 MED ORDER — MOVIPREP 100 G PO SOLR: 1.0000 | Freq: Once | ORAL | Status: DC

## 2013-05-22 NOTE — Patient Instructions (Addendum)
You have been scheduled for a colonoscopy with propofol. Please follow written instructions given to you at your visit today.  Please pick up your prep kit at the pharmacy within the next 1-3 days. If you use inhalers (even only as needed), please bring them with you on the day of your procedure. Your physician has requested that you go to www.startemmi.com and enter the access code given to you at your visit today. This web site gives a general overview about your procedure. However, you should still follow specific instructions given to you by our office regarding your preparation for the procedure.  CC: Dr Timothy Lasso

## 2013-05-22 NOTE — Progress Notes (Signed)
[name] 04/25/1956 MRN [id number]   History of Present Illness:  This is a 57 year old white female who has recovered from an acute episode of lower abdominal pain which occurred in a similar fashion exactly 1 year ago and both CT scans of the abdomen showed acute inflammatory changes in the right lower quadrant suggestive of colitis or appendicitis. She has a family history of Crohn's disease. She responded to bowel rest as well as Cipro and Flagyl for 10 days with complete relief of her symptoms. Her initial white blood cell count was 13,000 with a sedimentation rate which remained normal at 15. She had a colonoscopy in March 2010 which looked normal but the TI was not intubated.. Random biopsies of the cecum showed normal colon mucosa. Her upper endoscopy in March 2010 showed gastritis with a small hiatal hernia. She has been on Prilosec 20 mg daily. There is a history of a laparoscopic cholecystectomy.   Past Medical History  Diagnosis Date  . SVT (supraventricular tachycardia)     Hx of, s/p EP study with catheter ablation of AV nodal; re-entry tachycardia 01/10/06, s/p adenosine myoview, 10/06 showing an EF of 70% with no ischemia  . Hypothyroidism   . Hyperlipidemia   . Hyperglycemia     Borderline  . History of IBS   . Kidney stones   . Right thyroid nodule      s/p thyroid biopsy; report states follicular epitehlial cells, histiocytes, colloid, and lymphocytes (thyroiditis). Suggest a non-neoplastic process such as a hyperplastic nodule/ non-neoplastic goiter in a background of thyroiditis. Thyroid biopsy was done under a thyroid ultrasound at [facility].  Marland Kitchen CAD (coronary artery disease)     minimal plaque by cath 4/07: mLAD 30%, EF 60%  . IBS (irritable bowel syndrome)   . Hiatal hernia   . Hepatic steatosis 03/08/12  . Hiatal hernia    Past Surgical History  Procedure Laterality Date  . Biopsy thyroid    . Cesarean section      x2 via lower midline  .  Laparoscopic cholecystectomy w/ cholangiography    . Total thyroidectomy  11/08/2007  . Tubal ligation      reports that she has never smoked. She has never used smokeless tobacco. She reports that she does not drink alcohol or use illicit drugs. family history includes Coronary artery disease (age of onset: 30) in her mother and Heart attack (age of onset: 22) in her father.  There is no history of Colon cancer. Allergies  Allergen Reactions  . Codeine Nausea And Vomiting and Swelling        Review of Systems: Occasional heartburn, occasional constipation  The remainder of the 10 point ROS is negative except as outlined in H&P   Physical Exam: General appearance  Well developed, in no distress. Eyes- non icteric. HEENT nontraumatic, normocephalic. Mouth no lesions, tongue papillated, no cheilosis. Neck supple without adenopathy, thyroid not enlarged, no carotid bruits, no JVD. Lungs Clear to auscultation bilaterally. Cor normal S1, normal S2, regular rhythm, no murmur,  quiet precordium. Abdomen: Soft nontender abdomen with normal active bowel sounds. No distention. No palpable mass. Rectal: Not repeated. Extremities no pedal edema. Skin no lesions. Neurological alert and oriented x 3. Psychological normal mood and affect.  Assessment and Plan:   Problem #31 57 year old white female with 2 documented acute episodes of lower abdominal pain associated with inflammatory changes in the right lower quadrant. She did not have a normal CT scan between the 2 episodes and  therefore we are not sure if there is any congenital abnormality in the right lower quadrant. I am somewhat concerned about the possibility of Crohn's disease. Her last colonoscopy was normal but we did not intubate the terminal ileum. I suggested this time to repeat the CT scan of the abdomen or repeat the colonoscopy to assess her anatomy at her baseline. We will try to intubate the terminal ileum. She decided to go  ahead with a colonoscopy at this time. We will schedule that.  05/22/2013 [name]

## 2013-06-11 ENCOUNTER — Ambulatory Visit (HOSPITAL_COMMUNITY): Admit: 2013-06-11 | Payer: Self-pay | Admitting: Internal Medicine

## 2013-06-11 ENCOUNTER — Encounter (HOSPITAL_COMMUNITY): Payer: Self-pay

## 2013-06-11 SURGERY — COLONOSCOPY WITH PROPOFOL
Anesthesia: Monitor Anesthesia Care

## 2013-06-27 ENCOUNTER — Ambulatory Visit (AMBULATORY_SURGERY_CENTER): Payer: BC Managed Care – PPO | Admitting: Internal Medicine

## 2013-06-27 ENCOUNTER — Encounter: Payer: Self-pay | Admitting: Internal Medicine

## 2013-06-27 VITALS — BP 121/70 | HR 70 | Temp 98.7°F | Resp 16 | Ht 65.0 in | Wt 178.0 lb

## 2013-06-27 DIAGNOSIS — R933 Abnormal findings on diagnostic imaging of other parts of digestive tract: Secondary | ICD-10-CM

## 2013-06-27 MED ORDER — SODIUM CHLORIDE 0.9 % IV SOLN: 500.0000 mL | INTRAVENOUS | Status: DC

## 2013-06-27 NOTE — Op Note (Signed)
[facility] [address] [place], [address]   COLONOSCOPY PROCEDURE REPORT  PATIENT: [name], [name]  MR#: [id number] BIRTHDATE: 05-Jun-1956 , 56  yrs. old GENDER: Female ENDOSCOPIST: Hart Carwin, MD REFERRED FA:OZHY Timothy Lasso, M.D. PROCEDURE DATE:  06/27/2013 PROCEDURE:   Colonoscopy, diagnostic First Screening Colonoscopy - Avg.  risk and is 50 yrs.  old or older - No.  Prior Negative Screening - Now for repeat screening. N/A  History of Adenoma - Now for follow-up colonoscopy & has been > or = to 3 yrs.  N/A  Polyps Removed Today? No.  Recommend repeat exam, <10 yrs? No. ASA CLASS:   Class II INDICATIONS:prior colon 2010 was normal, recent hospitalization for acute lower abd. pain, CT scan siggests appendicitis MEDICATIONS: MAC sedation, administered by CRNA and propofol (Diprivan) 250mg  IV  DESCRIPTION OF PROCEDURE:   After the risks benefits and alternatives of the procedure were thoroughly explained, informed consent was obtained.  A digital rectal exam revealed no abnormalities of the rectum.   The LB [id number]  endoscope was introduced through the anus and advanced to the cecum, which was identified by both the appendix and ileocecal valve. No adverse events experienced.   The quality of the prep was good, using MoviPrep  The instrument was then slowly withdrawn as the colon was fully examined.      COLON FINDINGS: There was moderate diverticulosis noted in the sigmoid colon with associated angulation, tortuosity and luminal narrowing.  Retroflexed views revealed no abnormalities.Cecal pouch was normal, distensible, appendiceal opening unremarkable, Ileocecal valve closed- I was unble to traverse into TI, no deformity noted. The time to cecum=6 minutes 35 seconds. Withdrawal time=7 minutes 19 seconds.  The scope was withdrawn and the procedure completed. COMPLICATIONS: There were no complications.  ENDOSCOPIC IMPRESSION: There was  moderate diverticulosis noted in the sigmoid colon no evidence of colitis TIO unable to intubate appendiceal opening normal  RECOMMENDATIONS: High fiber diet   eSigned:  Hart Carwin, MD 06/27/2013 4:01 PM   cc:

## 2013-06-27 NOTE — Progress Notes (Signed)
Patient did not experience any of the following events: a burn prior to discharge; a fall within the facility; wrong site/side/patient/procedure/implant event; or a hospital transfer or hospital admission upon discharge from the facility. (G8907) Patient did not have preoperative order for IV antibiotic SSI prophylaxis. (G8918)  

## 2013-06-27 NOTE — Patient Instructions (Addendum)
YOU HAD AN ENDOSCOPIC PROCEDURE TODAY AT THE [facility]: Refer to the procedure report that was given to you for any specific questions about what was found during the examination.  If the procedure report does not answer your questions, please call your gastroenterologist to clarify.  If you requested that your care partner not be given the details of your procedure findings, then the procedure report has been included in a sealed envelope for you to review at your convenience later.  YOU SHOULD EXPECT: Some feelings of bloating in the abdomen. Passage of more gas than usual.  Walking can help get rid of the air that was put into your GI tract during the procedure and reduce the bloating. If you had a lower endoscopy (such as a colonoscopy or flexible sigmoidoscopy) you may notice spotting of blood in your stool or on the toilet paper. If you underwent a bowel prep for your procedure, then you may not have a normal bowel movement for a few days.  DIET: Your first meal following the procedure should be a light meal and then it is ok to progress to your normal diet.  A half-sandwich or bowl of soup is an example of a good first meal.  Heavy or fried foods are harder to digest and may make you feel nauseous or bloated.  Likewise meals heavy in dairy and vegetables can cause extra gas to form and this can also increase the bloating.  Drink plenty of fluids but you should avoid alcoholic beverages for 24 hours.  ACTIVITY: Your care partner should take you home directly after the procedure.  You should plan to take it easy, moving slowly for the rest of the day.  You can resume normal activity the day after the procedure however you should NOT DRIVE or use heavy machinery for 24 hours (because of the sedation medicines used during the test).    SYMPTOMS TO REPORT IMMEDIATELY: A gastroenterologist can be reached at any hour.  During normal business hours, 8:30 AM to 5:00 PM Monday through Friday,  call [phone].  After hours and on weekends, please call the GI answering service at [phone] who will take a message and have the physician on call contact you.   Following lower endoscopy (colonoscopy or flexible sigmoidoscopy):  Excessive amounts of blood in the stool  Significant tenderness or worsening of abdominal pains  Swelling of the abdomen that is new, acute  Fever of 100F or higher   FOLLOW UP: If any biopsies were taken you will be contacted by phone or by letter within the next 1-3 weeks.  Call your gastroenterologist if you have not heard about the biopsies in 3 weeks.  Our staff will call the home number listed on your records the next business day following your procedure to check on you and address any questions or concerns that you may have at that time regarding the information given to you following your procedure. This is a courtesy call and so if there is no answer at the home number and we have not heard from you through the emergency physician on call, we will assume that you have returned to your regular daily activities without incident.  SIGNATURES/CONFIDENTIALITY: You and/or your care partner have signed paperwork which will be entered into your electronic medical record.  These signatures attest to the fact that that the information above on your After Visit Summary has been reviewed and is understood.  Full responsibility of the confidentiality of   this discharge information lies with you and/or your care-partner.  Diverticulosis and high fiber diet information given. 

## 2013-06-27 NOTE — Progress Notes (Signed)
Report to pacu rn, vss, bbs=clear 

## 2013-06-28 ENCOUNTER — Telehealth: Payer: Self-pay | Admitting: *Deleted

## 2013-06-28 NOTE — Telephone Encounter (Signed)
  Follow up Call-  Call back number 06/27/2013  Post procedure Call Back phone  # [phone]  Permission to leave phone message Yes     Patient questions:  Do you have a fever, pain , or abdominal swelling? no  States did have gas pains last night, but better today Pain Score  0 *  Have you tolerated food without any problems? yes  Have you been able to return to your normal activities? yes  Do you have any questions about your discharge instructions: Diet   no Medications  no Follow up visit  no  Do you have questions or concerns about your Care? no  Actions: * If pain score is 4 or above: No action needed, pain <4.

## 2013-10-09 ENCOUNTER — Other Ambulatory Visit: Payer: Self-pay | Admitting: Internal Medicine

## 2014-01-18 ENCOUNTER — Ambulatory Visit: Payer: Self-pay

## 2014-01-21 ENCOUNTER — Ambulatory Visit (INDEPENDENT_AMBULATORY_CARE_PROVIDER_SITE_OTHER): Payer: BC Managed Care – PPO

## 2014-01-21 VITALS — BP 116/70 | HR 94 | Resp 18

## 2014-01-21 DIAGNOSIS — M7750 Other enthesopathy of unspecified foot: Secondary | ICD-10-CM

## 2014-01-21 DIAGNOSIS — R52 Pain, unspecified: Secondary | ICD-10-CM

## 2014-01-21 DIAGNOSIS — M7670 Peroneal tendinitis, unspecified leg: Secondary | ICD-10-CM

## 2014-01-21 DIAGNOSIS — M7150 Other bursitis, not elsewhere classified, unspecified site: Secondary | ICD-10-CM

## 2014-01-21 NOTE — Patient Instructions (Signed)
ICE INSTRUCTIONS  Apply ice or cold pack to the affected area at least 3 times a day for 10-15 minutes each time.  You should also use ice after prolonged activity or vigorous exercise.  Do not apply ice longer than 20 minutes at one time.  Always keep a cloth between your skin and the ice pack to prevent burns.  Being consistent and following these instructions will help control your symptoms.  We suggest you purchase a gel ice pack because they are reusable and do bit leak.  Some of them are designed to wrap around the area.  Use the method that works best for you.  Here are some other suggestions for icing.   Use a frozen bag of peas or corn-inexpensive and molds well to your body, usually stays frozen for 10 to 20 minutes.  Wet a towel with cold water and squeeze out the excess until it's damp.  Place in a bag in the freezer for 20 minutes. Then remove and use.  Alternate warm compress and ice pack 10 minutes each is 2 or 3 times every day for the next several weeks.  Also take Tylenol aspirin Advil or Aleve as needed for pain

## 2014-01-21 NOTE — Progress Notes (Signed)
   Subjective:    Patient ID: [name], female    DOB: [birth year], 58 y.o.   MRN: [id number]  HPI the side of my left foot hurts and has been hurting for about a month and about 20 yrs ago I had the plantar fasciitis and got a shot in it and burns and throbs and feels tight and the bone feels larger than on the right    Review of Systems  Musculoskeletal:       Joint pain  Allergic/Immunologic: Positive for environmental allergies.  All other systems reviewed and are negative.       Objective:   Physical Exam Neurovascular status is intact pedal pulses palpable bilateral epicritic and proprioceptive sensations intact and symmetric bilateral. Patient does have some slight tenderness in the base of the fifth metatarsal laterally on the left as opposed the right exacerbated by tapered shoe with and inflator last. At this time no history of injury trauma no other  abnormalities. Patient has tenderness with certain shoes and activities the lateral fifth metatarsal base cannot rule out a bursitis of the peroneal tendon insertion site and metatarsal base and left foot Dr. Aurther Lofterry recommend warm compress ice packs and tolerated with the for pain       Assessment & Plan:  Assessment possible mild capsulitis arthropathy as well is capsulitis fifth MTP area left foot no history of injury trauma noted current time. Recheck in one to 2 fails to improve be K. for more aggressive invasive options further noninvasive studies may be considered such as MRI next  [name] DPM

## 2014-03-06 ENCOUNTER — Other Ambulatory Visit: Payer: Self-pay | Admitting: Obstetrics and Gynecology

## 2014-04-19 ENCOUNTER — Other Ambulatory Visit: Payer: Self-pay | Admitting: Internal Medicine

## 2014-08-01 ENCOUNTER — Encounter: Payer: Self-pay | Admitting: Internal Medicine

## 2014-09-13 ENCOUNTER — Other Ambulatory Visit: Payer: Self-pay | Admitting: Obstetrics and Gynecology

## 2014-09-13 DIAGNOSIS — N644 Mastodynia: Secondary | ICD-10-CM

## 2014-09-25 ENCOUNTER — Ambulatory Visit
Admission: RE | Admit: 2014-09-25 | Discharge: 2014-09-25 | Disposition: A | Discharge status: Home / Self Care | Payer: BC Managed Care – PPO | Source: Ambulatory Visit | Attending: Obstetrics and Gynecology | Admitting: Obstetrics and Gynecology

## 2014-09-25 DIAGNOSIS — N644 Mastodynia: Secondary | ICD-10-CM

## 2015-05-27 ENCOUNTER — Telehealth: Payer: Self-pay | Admitting: Internal Medicine

## 2015-05-27 NOTE — Telephone Encounter (Signed)
Spoke with the patient. She states she feels she is fine now, but had been without IBS symptoms for so long she wanted to be certain she was doing the right thing.She asks if she should be doing anything differently. Her concern comes from the change in the IBS which she says was more of the diarrhea type previously. She developed constipation and went several days without a BM. Her PCP suggested she take Miralax twice in one day. If she had a BM, then she would only take Miralax once the following day. She reports this was successful. The following week, she had the same issue, so she repeated the process. She took 1 Levsin today because she felt a little crampy. She has had a normal bowel movement. No diet changes, infections or new medications. Any suggestions?

## 2015-05-28 NOTE — Telephone Encounter (Signed)
I agree with the recommendations. How about a Probiotic?

## 2015-05-29 NOTE — Telephone Encounter (Signed)
On Align 

## 2015-07-10 ENCOUNTER — Other Ambulatory Visit: Payer: Self-pay | Admitting: Obstetrics and Gynecology

## 2015-07-11 LAB — CYTOLOGY - PAP

## 2016-02-06 DIAGNOSIS — H5203 Hypermetropia, bilateral: Secondary | ICD-10-CM | POA: Diagnosis not present

## 2016-06-01 DIAGNOSIS — F419 Anxiety disorder, unspecified: Secondary | ICD-10-CM | POA: Diagnosis not present

## 2016-06-01 DIAGNOSIS — E039 Hypothyroidism, unspecified: Secondary | ICD-10-CM | POA: Diagnosis not present

## 2016-06-01 DIAGNOSIS — K219 Gastro-esophageal reflux disease without esophagitis: Secondary | ICD-10-CM | POA: Diagnosis not present

## 2016-06-01 DIAGNOSIS — E785 Hyperlipidemia, unspecified: Secondary | ICD-10-CM | POA: Diagnosis not present

## 2016-06-17 DIAGNOSIS — R05 Cough: Secondary | ICD-10-CM | POA: Diagnosis not present

## 2016-06-17 DIAGNOSIS — Z0000 Encounter for general adult medical examination without abnormal findings: Secondary | ICD-10-CM | POA: Diagnosis not present

## 2016-06-17 DIAGNOSIS — E039 Hypothyroidism, unspecified: Secondary | ICD-10-CM | POA: Diagnosis not present

## 2016-06-17 DIAGNOSIS — J019 Acute sinusitis, unspecified: Secondary | ICD-10-CM | POA: Diagnosis not present

## 2016-06-17 DIAGNOSIS — E785 Hyperlipidemia, unspecified: Secondary | ICD-10-CM | POA: Diagnosis not present

## 2016-06-17 DIAGNOSIS — H6690 Otitis media, unspecified, unspecified ear: Secondary | ICD-10-CM | POA: Diagnosis not present

## 2016-06-22 DIAGNOSIS — J329 Chronic sinusitis, unspecified: Secondary | ICD-10-CM | POA: Diagnosis not present

## 2016-06-22 DIAGNOSIS — E039 Hypothyroidism, unspecified: Secondary | ICD-10-CM | POA: Diagnosis not present

## 2016-06-22 DIAGNOSIS — F419 Anxiety disorder, unspecified: Secondary | ICD-10-CM | POA: Diagnosis not present

## 2016-06-22 DIAGNOSIS — E785 Hyperlipidemia, unspecified: Secondary | ICD-10-CM | POA: Diagnosis not present

## 2016-07-30 DIAGNOSIS — Z23 Encounter for immunization: Secondary | ICD-10-CM | POA: Diagnosis not present

## 2016-08-03 DIAGNOSIS — R109 Unspecified abdominal pain: Secondary | ICD-10-CM | POA: Diagnosis not present

## 2016-08-04 ENCOUNTER — Other Ambulatory Visit: Payer: Self-pay | Admitting: Internal Medicine

## 2016-08-04 ENCOUNTER — Ambulatory Visit
Admission: RE | Admit: 2016-08-04 | Discharge: 2016-08-04 | Disposition: A | Discharge status: Home / Self Care | Payer: BLUE CROSS/BLUE SHIELD | Source: Ambulatory Visit | Attending: Internal Medicine | Admitting: Internal Medicine

## 2016-08-04 DIAGNOSIS — N200 Calculus of kidney: Secondary | ICD-10-CM

## 2016-08-04 DIAGNOSIS — M545 Low back pain: Secondary | ICD-10-CM

## 2016-08-05 DIAGNOSIS — L814 Other melanin hyperpigmentation: Secondary | ICD-10-CM | POA: Diagnosis not present

## 2016-08-05 DIAGNOSIS — D1801 Hemangioma of skin and subcutaneous tissue: Secondary | ICD-10-CM | POA: Diagnosis not present

## 2016-08-05 DIAGNOSIS — L821 Other seborrheic keratosis: Secondary | ICD-10-CM | POA: Diagnosis not present

## 2016-08-10 ENCOUNTER — Encounter: Payer: Self-pay | Admitting: Nurse Practitioner

## 2016-08-10 ENCOUNTER — Ambulatory Visit (INDEPENDENT_AMBULATORY_CARE_PROVIDER_SITE_OTHER): Payer: BLUE CROSS/BLUE SHIELD | Admitting: Nurse Practitioner

## 2016-08-10 VITALS — BP 130/70 | HR 70 | Ht 65.0 in | Wt 177.0 lb

## 2016-08-10 DIAGNOSIS — R1012 Left upper quadrant pain: Secondary | ICD-10-CM

## 2016-08-10 DIAGNOSIS — K219 Gastro-esophageal reflux disease without esophagitis: Secondary | ICD-10-CM

## 2016-08-10 NOTE — Progress Notes (Signed)
Reviewed and agree with documentation and assessment and plan. K. Veena Ankush Gintz , MD   

## 2016-08-10 NOTE — Patient Instructions (Addendum)
Take the Prilosec 20 mg, 1 tablet every morning. You have been scheduled for an endoscopy. Please follow written instructions given to you at your visit today. If you use inhalers (even only as needed), please bring them with you on the day of your procedure. Your physician has requested that you go to www.startemmi.com and enter the access code given to you at your visit today. This web site gives a general overview about your procedure. However, you should still follow specific instructions given to you by our office regarding your preparation for the procedure.  We made you a follow up appointmnent with Dr. Lavon PaganiniNandigam for 10-19-2016 at 9:30 am.        Normal BMI (Body Mass Index- based on height and weight) is between 19 and 25. Your BMI today is Body mass index is 29.45 kg/m. Marland Kitchen. Please consider follow up  regarding your BMI with your Primary Care Provider.

## 2016-08-10 NOTE — Progress Notes (Signed)
HPI: This is a 60 year old female referred by PCP Dr. Pearson Grippe for evaluation of abdominal pain. She was formerly followed by Dr. Juanda Chance for IBS.   May 2013 RLQ pain-CT scans suggesed acute appendicitis. General Surgery saw her in ED, didn't feel this was the case and suspicion for IBD raised instead. Patient seen here after ED, colonoscopy was scheduled but cancelled because symptoms improved with antibiotics    July 2014 Represented with LEFT sided abdominal pain, CTscan ordered and patient treated empirically again for diverticulitis. CTscan actually suggested uncomplicated appendicitis again. Symptoms improved with antibiotics Colonoscopy was ultimately done August 2014 to evaluate CT findings but the terminal ileum could not be intubated due to closure of the ileocecal valve. Pertinent findings included moderate diverticulosis in the sigmoid with angulation, tortuosity and luminal narrowing.  Patient with chronic, intermittent abdominal pain. Now with a 6 week history of left upper quadrant/left mid abdominal pain radiating around left side. Episodes almost daily, last up to 2 hours and are unrelated to eating. Twisting to the left can mildly aggravate the pain but otherwise not related to movement. She has early satiety, waves of nausea.. Able to sleep through the night .No weight loss Takes a daily baby aspirin, no other significant NSAID use. Takes Prilosec as needed for heartburn .  Contrast CT scan of the abdomen and pelvis a few days ago without acute findings. There was a nonobstructing 6 mm lower Pole left renal calculus. No evidence for diverticulitis.  Labs at PCPs office  Wbc 11.3, hemoglobin 15.3, urinalysis unremarkable.   Past Medical History:  Diagnosis Date  . CAD (coronary artery disease)    minimal plaque by cath 4/07: mLAD 30%, EF 60%  . Hepatic steatosis 03/08/12  . Hiatal hernia   . History of IBS   . Hyperglycemia    Borderline  . Hyperlipidemia   .  Hypothyroidism   . IBS (irritable bowel syndrome)   . Kidney stones   . Right thyroid nodule     s/p thyroid biopsy; report states follicular epitehlial cells, histiocytes, colloid, and lymphocytes (thyroiditis). Suggest a non-neoplastic process such as a hyperplastic nodule/ non-neoplastic goiter in a background of thyroiditis. Thyroid biopsy was done under a thyroid ultrasound at [facility].  . SVT (supraventricular tachycardia) (HCC)    Hx of, s/p EP study with catheter ablation of AV nodal; re-entry tachycardia 01/10/06, s/p adenosine myoview, 10/06 showing an EF of 70% with no ischemia    Patient's surgical history, family medical history, social history, and allergies were all reviewed in Epic    Physical Exam: Ht 5\' 5"  (1.651 m)   Wt 177 lb (80.3 kg)   BMI 29.45 kg/m   GENERAL: Well developed white female in NAD PSYCH: :Pleasant, cooperative, normal affect HEENT: Normocephalic, conjunctiva pink, mucous membranes moist, neck supple without masses CARDIAC:  RRR, no murmur heard, no peripheral edema PULM: Normal respiratory effort, lungs CTA bilaterally, no wheezing ABDOMEN:  soft, nondistended, mild LUQ tenderness, no obvious masses, no hepatomegaly,  normal bowel sounds SKIN:  turgor, no lesions seen Musculoskeletal:  Normal muscle tone, 5/5 strength NEURO: Alert and oriented x 3, no focal neurologic deficits   ASSESSMENT and PLAN:  89. 60 year old female with IBS presenting with recurrent abdominal pain. This time pain in LUQ radiating around left side. Her GERD symptoms are worse now as well. She has early satiety, waves of nausea. Rule out gastritis, PUD. Pain could be musculoskeletal, she wonders  if stress  contributing (lost dad and sister over the summer). Diverticulitis not excluded. Pain could be urologic though the small left renal stone stone on CTscan is nonobstructing.   -She has Xanax at home, has not tried it for the discomfort. Advised trying half a  tablet this evening to see how her symptoms respond-for worsening.  -If no improvement with anxiolytic then begin Protonix every morning .  -Patient will be scheduled for an upper endoscopy to be done in the next 2-3 weeks with Dr. Lavon PaganiniNandigam. If symptoms resolve or prove to stress related or urologic in nature  then patient   cancel the procedure giving at least 3-4 days notice.  She is scheduled to see Urology soon.      2  GERD, almost daily heartburn. Takes PPI as needed. -daily PPI for now.

## 2016-08-11 DIAGNOSIS — N200 Calculus of kidney: Secondary | ICD-10-CM | POA: Diagnosis not present

## 2016-08-11 DIAGNOSIS — M5136 Other intervertebral disc degeneration, lumbar region: Secondary | ICD-10-CM | POA: Diagnosis not present

## 2016-08-13 DIAGNOSIS — Z1231 Encounter for screening mammogram for malignant neoplasm of breast: Secondary | ICD-10-CM | POA: Diagnosis not present

## 2016-08-13 DIAGNOSIS — Z01419 Encounter for gynecological examination (general) (routine) without abnormal findings: Secondary | ICD-10-CM | POA: Diagnosis not present

## 2016-08-13 DIAGNOSIS — Z6828 Body mass index (BMI) 28.0-28.9, adult: Secondary | ICD-10-CM | POA: Diagnosis not present

## 2016-09-07 ENCOUNTER — Encounter: Payer: BLUE CROSS/BLUE SHIELD | Admitting: Gastroenterology

## 2016-09-15 DIAGNOSIS — R739 Hyperglycemia, unspecified: Secondary | ICD-10-CM | POA: Diagnosis not present

## 2016-09-15 DIAGNOSIS — E785 Hyperlipidemia, unspecified: Secondary | ICD-10-CM | POA: Diagnosis not present

## 2016-09-15 DIAGNOSIS — E039 Hypothyroidism, unspecified: Secondary | ICD-10-CM | POA: Diagnosis not present

## 2016-09-22 DIAGNOSIS — E7800 Pure hypercholesterolemia, unspecified: Secondary | ICD-10-CM | POA: Diagnosis not present

## 2016-09-22 DIAGNOSIS — F329 Major depressive disorder, single episode, unspecified: Secondary | ICD-10-CM | POA: Diagnosis not present

## 2016-10-19 ENCOUNTER — Ambulatory Visit: Payer: BLUE CROSS/BLUE SHIELD | Admitting: Gastroenterology

## 2016-11-11 DIAGNOSIS — M545 Low back pain: Secondary | ICD-10-CM | POA: Diagnosis not present

## 2016-11-16 DIAGNOSIS — E7800 Pure hypercholesterolemia, unspecified: Secondary | ICD-10-CM | POA: Diagnosis not present

## 2016-11-22 DIAGNOSIS — E039 Hypothyroidism, unspecified: Secondary | ICD-10-CM | POA: Diagnosis not present

## 2016-11-22 DIAGNOSIS — R3129 Other microscopic hematuria: Secondary | ICD-10-CM | POA: Diagnosis not present

## 2016-11-22 DIAGNOSIS — R1032 Left lower quadrant pain: Secondary | ICD-10-CM | POA: Diagnosis not present

## 2016-11-22 DIAGNOSIS — R739 Hyperglycemia, unspecified: Secondary | ICD-10-CM | POA: Diagnosis not present

## 2016-11-26 ENCOUNTER — Telehealth: Payer: Self-pay | Admitting: Nurse Practitioner

## 2016-11-26 NOTE — Telephone Encounter (Signed)
Patient has been taking the Protonix as discussed at her visit with Charyl DancerPaul Guenther in Oct 2017. She has seen her GYN, her urologist, her PCP and is seeing a chiropractor every other week. She felt she was doing better until a couple of weeks ago. She calls with complaints of a returned discomfort in her  Lower abdomen wrapping into her back. She describes it as being a pain similar to menstrual cramping. She notes it is intermittent, worse after eating and sometimes intensified with bending at the waist. She has not had diarrhea. Treats periodic constipation with Miralax. She is not on any anti depressants or anti anxiety medications.

## 2016-11-26 NOTE — Telephone Encounter (Signed)
Reviewed Gunnar FusiPaula Guenther's note,  CT abd & pelvis 08/2016 and Colonoscopy reports.  No evidence of GI pathology to explain the pain. Could be secondary to Nepholithiasis (had a 6mm non obstructive stone) or musculoskeletal in nature. Please advise patient to follow up with urology to see if she needs lithotripsy

## 2016-11-29 NOTE — Telephone Encounter (Signed)
Pt has been notified and will followup with Urology and PCP

## 2016-12-03 ENCOUNTER — Emergency Department (HOSPITAL_COMMUNITY): Payer: BLUE CROSS/BLUE SHIELD

## 2016-12-03 ENCOUNTER — Encounter (HOSPITAL_COMMUNITY): Payer: Self-pay

## 2016-12-03 ENCOUNTER — Emergency Department (HOSPITAL_COMMUNITY)
Admission: EM | Admit: 2016-12-03 | Discharge: 2016-12-03 | Disposition: A | Discharge status: Home / Self Care | Payer: BLUE CROSS/BLUE SHIELD | Attending: Emergency Medicine | Admitting: Emergency Medicine

## 2016-12-03 DIAGNOSIS — R109 Unspecified abdominal pain: Secondary | ICD-10-CM | POA: Diagnosis not present

## 2016-12-03 DIAGNOSIS — N132 Hydronephrosis with renal and ureteral calculous obstruction: Secondary | ICD-10-CM | POA: Insufficient documentation

## 2016-12-03 DIAGNOSIS — I2510 Atherosclerotic heart disease of native coronary artery without angina pectoris: Secondary | ICD-10-CM | POA: Insufficient documentation

## 2016-12-03 DIAGNOSIS — Z79899 Other long term (current) drug therapy: Secondary | ICD-10-CM | POA: Insufficient documentation

## 2016-12-03 DIAGNOSIS — E039 Hypothyroidism, unspecified: Secondary | ICD-10-CM | POA: Diagnosis not present

## 2016-12-03 DIAGNOSIS — N200 Calculus of kidney: Secondary | ICD-10-CM

## 2016-12-03 DIAGNOSIS — N23 Unspecified renal colic: Secondary | ICD-10-CM

## 2016-12-03 DIAGNOSIS — N201 Calculus of ureter: Secondary | ICD-10-CM

## 2016-12-03 LAB — URINALYSIS, ROUTINE W REFLEX MICROSCOPIC
BILIRUBIN URINE: NEGATIVE
Bacteria, UA: NONE SEEN
Glucose, UA: NEGATIVE mg/dL
KETONES UR: NEGATIVE mg/dL
Leukocytes, UA: NEGATIVE
Nitrite: NEGATIVE
PH: 5 (ref 5.0–8.0)
Protein, ur: NEGATIVE mg/dL
Specific Gravity, Urine: 1.019 (ref 1.005–1.030)

## 2016-12-03 LAB — CBC WITH DIFFERENTIAL/PLATELET
BASOS PCT: 1 %
Basophils Absolute: 0.1 10*3/uL (ref 0.0–0.1)
EOS ABS: 0.3 10*3/uL (ref 0.0–0.7)
Eosinophils Relative: 2 %
HEMATOCRIT: 42.6 % (ref 36.0–46.0)
Hemoglobin: 14.1 g/dL (ref 12.0–15.0)
LYMPHS ABS: 4.1 10*3/uL — AB (ref 0.7–4.0)
Lymphocytes Relative: 33 %
MCH: 29.1 pg (ref 26.0–34.0)
MCHC: 33.1 g/dL (ref 30.0–36.0)
MCV: 87.8 fL (ref 78.0–100.0)
MONO ABS: 1.1 10*3/uL — AB (ref 0.1–1.0)
MONOS PCT: 9 %
Neutro Abs: 6.9 10*3/uL (ref 1.7–7.7)
Neutrophils Relative %: 55 %
Platelets: 319 10*3/uL (ref 150–400)
RBC: 4.85 MIL/uL (ref 3.87–5.11)
RDW: 14.1 % (ref 11.5–15.5)
WBC: 12.4 10*3/uL — ABNORMAL HIGH (ref 4.0–10.5)

## 2016-12-03 LAB — BASIC METABOLIC PANEL
Anion gap: 8 (ref 5–15)
BUN: 27 mg/dL — AB (ref 6–20)
CALCIUM: 9.1 mg/dL (ref 8.9–10.3)
CHLORIDE: 106 mmol/L (ref 101–111)
CO2: 24 mmol/L (ref 22–32)
Creatinine, Ser: 1.08 mg/dL — ABNORMAL HIGH (ref 0.44–1.00)
GFR calc non Af Amer: 55 mL/min — ABNORMAL LOW (ref >60)
Glucose, Bld: 125 mg/dL — ABNORMAL HIGH (ref 65–99)
Potassium: 3.7 mmol/L (ref 3.5–5.1)
SODIUM: 138 mmol/L (ref 135–145)

## 2016-12-03 MED ORDER — ONDANSETRON HCL 4 MG/2ML IJ SOLN: 4.0000 mg | Freq: Once | INTRAMUSCULAR | Status: DC

## 2016-12-03 MED ORDER — KETOROLAC TROMETHAMINE 30 MG/ML IJ SOLN
30.0000 mg | Freq: Once | INTRAMUSCULAR | Status: AC
  Administered 2016-12-03: 30 mg via INTRAVENOUS
  Filled 2016-12-03: qty 1

## 2016-12-03 MED ORDER — SODIUM CHLORIDE 0.9 % IV BOLUS (SEPSIS)
1000.0000 mL | Freq: Once | INTRAVENOUS | Status: AC
  Administered 2016-12-03: 1000 mL via INTRAVENOUS

## 2016-12-03 MED ORDER — TAMSULOSIN HCL 0.4 MG PO CAPS: 0.4000 mg | ORAL_CAPSULE | Freq: Every day | ORAL | 0 refills | Status: DC

## 2016-12-03 MED ORDER — ONDANSETRON HCL 4 MG/2ML IJ SOLN
4.0000 mg | Freq: Once | INTRAMUSCULAR | Status: AC
  Administered 2016-12-03: 4 mg via INTRAVENOUS
  Filled 2016-12-03: qty 2

## 2016-12-03 MED ORDER — HYDROMORPHONE HCL 1 MG/ML IJ SOLN
1.0000 mg | Freq: Once | INTRAMUSCULAR | Status: AC
  Administered 2016-12-03: 1 mg via INTRAVENOUS
  Filled 2016-12-03: qty 1

## 2016-12-03 MED ORDER — IBUPROFEN 600 MG PO TABS: 600.0000 mg | ORAL_TABLET | Freq: Four times a day (QID) | ORAL | 0 refills | Status: DC | PRN

## 2016-12-03 MED ORDER — OXYCODONE-ACETAMINOPHEN 5-325 MG PO TABS: 1.0000 | ORAL_TABLET | Freq: Four times a day (QID) | ORAL | 0 refills | Status: DC | PRN

## 2016-12-03 MED ORDER — ONDANSETRON HCL 4 MG PO TABS: 4.0000 mg | ORAL_TABLET | Freq: Four times a day (QID) | ORAL | 0 refills | Status: DC | PRN

## 2016-12-03 NOTE — ED Provider Notes (Signed)
[facility]-EMERGENCY DEPT Provider Note   CSN: [id number] Arrival date & time: 12/03/16  1630     History   Chief Complaint Chief Complaint  Patient presents with  . Flank Pain    LEFT    HPI [name] is a 61 y.o. female.  HPI  62 year old female who presents with sudden onset of severe left flank pain starting 45 minutes prior to arrival. History of mild CAD and nephrolithiasis. Followed in past by [facility] w/ history of lithotripsy. Has had intermittent dull ache in left flank over past 5 months and seen by PCP, urology, and gynecology for evaluation of this. Did have CT in October showing 6 mm non-obstructing left renal stone on chart review. This feels the same as prior ureteral stones.   Denies fever, chills, vomiting, dysuria or urinary frequency. No hematuria. Some loose stool today.   Past Medical History:  Diagnosis Date  . CAD (coronary artery disease)    minimal plaque by cath 4/07: mLAD 30%, EF 60%  . Hepatic steatosis 03/08/12  . Hiatal hernia   . Hiatal hernia   . History of IBS   . Hyperglycemia    Borderline  . Hyperlipidemia   . Hypothyroidism   . IBS (irritable bowel syndrome)   . Kidney stones   . Right thyroid nodule     s/p thyroid biopsy; report states follicular epitehlial cells, histiocytes, colloid, and lymphocytes (thyroiditis). Suggest a non-neoplastic process such as a hyperplastic nodule/ non-neoplastic goiter in a background of thyroiditis. Thyroid biopsy was done under a thyroid ultrasound at [facility].  . SVT (supraventricular tachycardia) (HCC)    Hx of, s/p EP study with catheter ablation of AV nodal; re-entry tachycardia 01/10/06, s/p adenosine myoview, 10/06 showing an EF of 70% with no ischemia    Patient Active Problem List   Diagnosis Date Noted  . Nonspecific (abnormal) findings on radiological and other examination of gastrointestinal tract 03/09/2012  . RLQ abdominal pain 03/09/2012  . Palpitations 04/29/2011    . OTHER ACQUIRED ABSENCE OF ORGAN 01/08/2009  . THYROID NODULE, RIGHT 01/07/2009  . HYPOTHYROIDISM 01/07/2009  . IRRITABLE BOWEL SYNDROME 01/07/2009  . NEPHROLITHIASIS, HX OF 01/07/2009  . HYPERTRIGLYCERIDEMIA 12/25/2008  . HYPERLIPIDEMIA-MIXED 12/25/2008  . SVT/ PSVT/ PAT 12/25/2008  . CHEST PAIN-UNSPECIFIED 12/25/2008  . HEARTBURN 12/25/2008    Past Surgical History:  Procedure Laterality Date  . BIOPSY THYROID    . CESAREAN SECTION     x2 via lower midline  . LAPAROSCOPIC CHOLECYSTECTOMY W/ CHOLANGIOGRAPHY    . TOTAL THYROIDECTOMY  11/08/2007  . TUBAL LIGATION      OB History    No data available       Home Medications    Prior to Admission medications   Medication Sig Start Date End Date Taking? Authorizing Provider  acidophilus (RISAQUAD) CAPS capsule Take 1 capsule by mouth every morning.    Yes Historical Provider, MD  Calcium Carbonate-Vit D-Min 600-200 MG-UNIT TABS Take 1 tablet by mouth daily.     Yes Historical Provider, MD  escitalopram (LEXAPRO) 5 MG tablet Take 5 mg by mouth at bedtime. 09/22/16  Yes Historical Provider, MD  fenofibrate (TRICOR) 145 MG tablet Take 145 mg by mouth every other day.    Yes Historical Provider, MD  levothyroxine (SYNTHROID, LEVOTHROID) 112 MCG tablet Take 112 mcg by mouth daily before breakfast.    Yes Historical Provider, MD  multivitamin (THERAGRAN) per tablet Take 1 tablet by mouth daily.  Yes Historical Provider, MD  omeprazole (PRILOSEC) 20 MG capsule TAKE ONE CAPSULE BY MOUTH TWICE DAILY  Patient taking differently: TAKE 20 MG BY MOUTH TWICE DAILY AS NEEDE FOR REFLUX 10/09/13  Yes Hart Carwinora M Brodie, MD  traMADol (ULTRAM) 50 MG tablet Take 50 mg by mouth once.   Yes Historical Provider, MD  ibuprofen (ADVIL,MOTRIN) 600 MG tablet Take 1 tablet (600 mg total) by mouth every 6 (six) hours as needed. 12/03/16   Lavera Guiseana Duo Quillan Whitter, MD  ondansetron (ZOFRAN) 4 MG tablet Take 1 tablet (4 mg total) by mouth every 6 (six) hours as needed for  nausea or vomiting. 12/03/16   Lavera Guiseana Duo Leverne Tessler, MD  oxyCODONE-acetaminophen (PERCOCET/ROXICET) 5-325 MG tablet Take 1-2 tablets by mouth every 6 (six) hours as needed for severe pain. 12/03/16   Lavera Guiseana Duo Azreal Stthomas, MD  tamsulosin (FLOMAX) 0.4 MG CAPS capsule Take 1 capsule (0.4 mg total) by mouth daily. 12/03/16   Lavera Guiseana Duo Janalyn Higby, MD    Family History Family History  Problem Relation Age of Onset  . Coronary artery disease Mother 3260  . Heart attack Father 4960    MI x2  . Colon cancer Neg Hx     Social History Social History  Substance Use Topics  . Smoking status: Never Smoker  . Smokeless tobacco: Never Used  . Alcohol use No     Allergies   Codeine   Review of Systems Review of Systems 10/14 systems reviewed and are negative other than those stated in the HPI   Physical Exam Updated Vital Signs BP 121/73 (BP Location: Right Arm)   Pulse 70   Temp 97.4 F (36.3 C) (Oral)   Resp 18   Ht 5\' 6"  (1.676 m)   Wt 177 lb (80.3 kg)   SpO2 97%   BMI 28.57 kg/m   Physical Exam Physical Exam  Nursing note and vitals reviewed. Constitutional: Wnon-toxic, and in mild distress 2/2 to pain Head: Normocephalic and atraumatic.  Mouth/Throat: Oropharynx is clear and moist.  Neck: Normal range of motion. Neck supple.  Cardiovascular: Normal rate and regular rhythm.   Pulmonary/Chest: Effort normal and breath sounds normal.  Abdominal: Soft. There is no tenderness. There is no rebound and no guarding. Left CVA tenderness. Musculoskeletal: Normal range of motion.  Neurological: Alert, no facial droop, fluent speech, moves all extremities symmetrically Skin: Skin is warm and dry.  Psychiatric: Cooperative   ED Treatments / Results  Labs (all labs ordered are listed, but only abnormal results are displayed) Labs Reviewed  URINALYSIS, ROUTINE W REFLEX MICROSCOPIC - Abnormal; Notable for the following:       Result Value   Color, Urine AMBER (*)    APPearance CLOUDY (*)    Hgb urine dipstick  SMALL (*)    Squamous Epithelial / LPF 0-5 (*)    All other components within normal limits  CBC WITH DIFFERENTIAL/PLATELET - Abnormal; Notable for the following:    WBC 12.4 (*)    Lymphs Abs 4.1 (*)    Monocytes Absolute 1.1 (*)    All other components within normal limits  BASIC METABOLIC PANEL - Abnormal; Notable for the following:    Glucose, Bld 125 (*)    BUN 27 (*)    Creatinine, Ser 1.08 (*)    GFR calc non Af Amer 55 (*)    All other components within normal limits    EKG  EKG Interpretation None       Radiology Dg Abdomen 1 View  Result Date: 12/03/2016 CLINICAL DATA:  Sudden onset LEFT flank pain, history of LEFT-sided nephrolithiasis and ureteral stones EXAM: ABDOMEN - 1 VIEW COMPARISON:  CT abdomen pelvis 08/04/2016 FINDINGS: Surgical clips RIGHT upper quadrant from prior cholecystectomy. LEFT paraspinal calcification 7 x 4 mm diameter at the superior endplate of L3 suspicious for a LEFT UPJ/proximal ureteral calculus. No additional urinary tract calcifications. Multiple small pelvic phleboliths. Bowel gas pattern normal. Bones demineralized with mild degenerative changes lumbar spine. IMPRESSION: 7 x 4 mm LEFT paraspinal calcification at superior endplate of L3 suspicious for urinary tract calcification either at the LEFT ureteropelvic junction or proximal LEFT ureter. Electronically Signed   By: Ulyses Southward M.D.   On: 12/03/2016 17:42   US Renal  Result Date: 12/03/2016 CLINICAL DATA:  LEFT nephrolithiasis, LEFT flank pain sudden onset, prior history of stones EXAM: RENAL / URINARY TRACT ULTRASOUND COMPLETE COMPARISON:  Abdominal radiograph 12/03/2016, CT abdomen and pelvis 08/04/2016 FINDINGS: Right Kidney: Length: 11.3 cm. Normal cortical thickness and echogenicity. No mass, hydronephrosis or shadowing calcification. Left Kidney: Length: 12.1 cm. Normal cortical thickness. Upper normal cortical echogenicity. Mild LEFT hydronephrosis. Proximal LEFT ureter is mildly  dilated Bladder: Partially distended, normal appearance. RIGHT ureteral jet visualized. No LEFT ureteral jet seen. IMPRESSION: LEFT hydronephrosis and proximal LEFT hydroureter. Accompanying abdominal radiograph shows a proximal LEFT ureteral calculus. Electronically Signed   By: Ulyses Southward M.D.   On: 12/03/2016 18:01    Procedures Procedures (including critical care time)  Medications Ordered in ED Medications  ondansetron (ZOFRAN) injection 4 mg (4 mg Intravenous Refused 12/03/16 2002)  sodium chloride 0.9 % bolus 1,000 mL (0 mLs Intravenous Stopped 12/03/16 1849)  ketorolac (TORADOL) 30 MG/ML injection 30 mg (30 mg Intravenous Given 12/03/16 1712)  HYDROmorphone (DILAUDID) injection 1 mg (1 mg Intravenous Given 12/03/16 1712)  ondansetron (ZOFRAN) injection 4 mg (4 mg Intravenous Given 12/03/16 1712)     Initial Impression / Assessment and Plan / ED Course  I have reviewed the triage vital signs and the nursing notes.  Pertinent labs & imaging results that were available during my care of the patient were reviewed by me and considered in my medical decision making (see chart for details).     61 year old female with history of nephrolithiasis who presents with sudden onset of left flank pain prior to arrival. Appears secondary to pain but is nontoxic in no acute distress. Left CVA tenderness. Abdomen is soft and benign. Vital signs are stable. Records reviewed and patient with CT scan in October showing nonobstructing 6 mm left renal stone. Suspect that this is now causing ureteral obstruction. Ultrasound of the kidneys revealed left-sided hydronephrosis and hydroureter and presence of 7 x 4 mm renal stone on KUB. Pain well-controlled after IV fluids, Toradol, and hydromorphone. States that she is now pain-free. UA without signs of infection. No significant kidney injury. Discussed decrease chance of passing this stone given its larger size. She will call her urologist at [facility] ASAP for  follow-up. Strict return and follow-up instructions reviewed. She expressed understanding of all discharge instructions and felt comfortable with the plan of care.   Final Clinical Impressions(s) / ED Diagnoses   Final diagnoses:  Kidney stone  Ureteral colic  Ureterolithiasis    New Prescriptions New Prescriptions   IBUPROFEN (ADVIL,MOTRIN) 600 MG TABLET    Take 1 tablet (600 mg total) by mouth every 6 (six) hours as needed.   ONDANSETRON (ZOFRAN) 4 MG TABLET    Take 1 tablet (4 mg total)  by mouth every 6 (six) hours as needed for nausea or vomiting.   OXYCODONE-ACETAMINOPHEN (PERCOCET/ROXICET) 5-325 MG TABLET    Take 1-2 tablets by mouth every 6 (six) hours as needed for severe pain.   TAMSULOSIN (FLOMAX) 0.4 MG CAPS CAPSULE    Take 1 capsule (0.4 mg total) by mouth daily.     Lavera Guise, MD 12/03/16 [phone]

## 2016-12-03 NOTE — Discharge Instructions (Signed)
You have a 6-7 mm kidney stone in the ureter on the left side. This has a lower chance of passing on its own. Please call [facility] for close follow-up as soon as possible.  Return without fail for worsening symptoms, including intractable vomiting, intractable pain, fever, or any other symptoms concerning to you. Take medications as prescribed

## 2016-12-03 NOTE — ED Triage Notes (Signed)
PT C/O LEFT FLANK PAIN X30 MIN PTA. PT WAS DX'D WITH KIDNEY STONES 5 MONTHS AGO, AND HAS BEEN HAVING ATTACKS ON AND OFF. PT TOOK A TRAMADOL 50MG  PTA.

## 2016-12-03 NOTE — ED Notes (Signed)
Pt stated unable to give urine sample at this time 

## 2016-12-06 DIAGNOSIS — N200 Calculus of kidney: Secondary | ICD-10-CM | POA: Diagnosis not present

## 2016-12-06 DIAGNOSIS — N201 Calculus of ureter: Secondary | ICD-10-CM | POA: Diagnosis not present

## 2016-12-08 ENCOUNTER — Other Ambulatory Visit: Payer: Self-pay | Admitting: Urology

## 2016-12-08 DIAGNOSIS — N201 Calculus of ureter: Secondary | ICD-10-CM | POA: Diagnosis not present

## 2016-12-09 ENCOUNTER — Encounter (HOSPITAL_COMMUNITY): Payer: Self-pay | Admitting: *Deleted

## 2016-12-10 ENCOUNTER — Other Ambulatory Visit: Payer: Self-pay

## 2016-12-10 ENCOUNTER — Ambulatory Visit (HOSPITAL_COMMUNITY): Payer: BLUE CROSS/BLUE SHIELD

## 2016-12-10 ENCOUNTER — Encounter (HOSPITAL_COMMUNITY)
Admission: RE | Admit: 2016-12-10 | Discharge: 2016-12-10 | Disposition: A | Discharge status: Home / Self Care | Payer: BLUE CROSS/BLUE SHIELD | Source: Ambulatory Visit | Attending: Urology | Admitting: Urology

## 2016-12-10 DIAGNOSIS — Z791 Long term (current) use of non-steroidal anti-inflammatories (NSAID): Secondary | ICD-10-CM | POA: Diagnosis not present

## 2016-12-10 DIAGNOSIS — E039 Hypothyroidism, unspecified: Secondary | ICD-10-CM | POA: Diagnosis not present

## 2016-12-10 DIAGNOSIS — N201 Calculus of ureter: Secondary | ICD-10-CM | POA: Diagnosis not present

## 2016-12-10 DIAGNOSIS — K219 Gastro-esophageal reflux disease without esophagitis: Secondary | ICD-10-CM | POA: Diagnosis not present

## 2016-12-10 DIAGNOSIS — Z79899 Other long term (current) drug therapy: Secondary | ICD-10-CM | POA: Diagnosis not present

## 2016-12-10 DIAGNOSIS — F419 Anxiety disorder, unspecified: Secondary | ICD-10-CM | POA: Diagnosis not present

## 2016-12-12 NOTE — H&P (Signed)
Office Visit Report     12/08/2016   --------------------------------------------------------------------------------   [name]  MRN: [id number]  PRIMARY CARE:  Massie MaroonJames Y. Kim, MD  DOB: 1956-06-10, 61 year old Female  REFERRING:  Massie MaroonJames Y. Kim, MD  SSN: -[id number]  PROVIDER:  Bjorn PippinJohn Wrenn, M.D.    TREATING:  Jerilee FieldMatthew Wahneta Derocher, M.D.    LOCATION:  [facility] [phone]   --------------------------------------------------------------------------------   CC: I have ureteral stone.  HPI: Jennifer HugerSandra Richard is a 61 year-old female established patient who is here for ureteral stone.  The problem is on the left side. She first stated noticing pain on 12/03/2016. This is not her first kidney stone. She is currently having flank pain. She denies having back pain, groin pain, nausea, vomiting, fever, and chills. Pain is occuring on the left side. She has not caught a stone in her urine strainer since her symptoms began.   She has had eswl for treatment of her stones in the past.   Seen Oct 2017 with a 7 mm LLP stone. Sudden onset left flank pain 12/03/2016 and went to ED. KUB showed stone dropped into left proximal ureter with mild left hydro on the U/S. She saw Peyton NajjarLarry and KUB continued to show the stone. They continued passage but she would like a second opinion. She still has pain and hasn't seen a stone pass. UA is clear. I see her husband, so she wanted to see me.      ALLERGIES: Codeine    MEDICATIONS: Prilosec  Calcium Citrate  Fenofibrate 145 mg tablet  Levoxyl  Meloxicam 7.5 mg tablet 1-2 tablet PO Daily  Probiotic  Xanax     GU PSH: None   NON-GU PSH: Cholecystectomy C-Section Thyroid Surgery    GU PMH: Kidney Stone - 08/11/2016    NON-GU PMH: Other intervertebral disc degeneration, lumbar region - 08/11/2016 Anxiety GERD Hypothyroidism    FAMILY HISTORY: Bladder Cancer - Father Dementia - Sister Heart Disease - Mother, Father Kidney Stones -  Father Strokes - Mother, Father   SOCIAL HISTORY: Marital Status: Married Current Smoking Status: Patient has never smoked.  Drinks 1 caffeinated drink per day.     Notes: 1 son, 1 daughter    REVIEW OF SYSTEMS:    GU Review Female:   Patient denies frequent urination, hard to postpone urination, burning /pain with urination, get up at night to urinate, leakage of urine, stream starts and stops, trouble starting your stream, have to strain to urinate, and currently pregnant.  Gastrointestinal (Upper):   Patient denies nausea, vomiting, and indigestion/ heartburn.  Gastrointestinal (Lower):   Patient denies diarrhea and constipation.  Constitutional:   Patient denies fever, night sweats, weight loss, and fatigue.  Skin:   Patient denies skin rash/ lesion and itching.  Eyes:   Patient denies blurred vision and double vision.  Ears/ Nose/ Throat:   Patient denies sore throat and sinus problems.  Hematologic/Lymphatic:   Patient denies swollen glands and easy bruising.  Cardiovascular:   Patient denies leg swelling and chest pains.  Respiratory:   Patient denies cough and shortness of breath.  Endocrine:   Patient denies excessive thirst.  Musculoskeletal:   Patient denies back pain and joint pain.  Neurological:   Patient denies headaches and dizziness.  Psychologic:   Patient denies depression and anxiety.   VITAL SIGNS:      12/08/2016 11:08 AM  BP 103/64 mmHg  Pulse 105 /min  Temperature 98.2 F / 37 C  MULTI-SYSTEM PHYSICAL EXAMINATION:    Constitutional: Well-nourished. No physical deformities. Normally developed. Good grooming.  Neck: Neck symmetrical, not swollen. Normal tracheal position.  Respiratory: No labored breathing, no use of accessory muscles.   Cardiovascular: Normal temperature, normal extremity pulses, no swelling, no varicosities.  Neurologic / Psychiatric: Oriented to time, oriented to place, oriented to person. No depression, no anxiety, no agitation.      PAST DATA REVIEWED:  Source Of History:  Outside Source  X-Ray Review: Outside X-Ray: Reviewed Films.  Renal Ultrasound: Reviewed Films.  C.T. Abdomen/Pelvis: Reviewed Films.     PROCEDURES:          Urinalysis - 81003 Dipstick Dipstick Cont'd  Color: Yellow Bilirubin: Neg  Appearance: Clear Ketones: Neg  Specific Gravity: 1.020 Blood: Neg  pH: <=5.0 Protein: Neg  Glucose: Neg Urobilinogen: 0.2    Nitrites: Neg    Leukocyte Esterase: Neg    ASSESSMENT:      ICD-10 Details  1 GU:   Calculus Ureter - N20.1    PLAN:           Schedule Return Visit/Planned Activity: Next Available Appointment - Schedule Surgery          Document Letter(s):  Created for Patient: Clinical Summary         Notes:   left proximal ureteral stone - I discussed with the patient anther husband the nature risks and benefits of continued stone passage, off label use of alpha blockers, shockwave lithotripsy or ureteroscopy. All questions answered. She elects to proceed with left ESWL.   cc: Dr. Selena Batten      * Signed by Jerilee Field, M.D. on 12/08/16 at 8:36 PM (EST)*     The information contained in this medical record document is considered private and confidential patient information. This information can only be used for the medical diagnosis and/or medical services that are being provided by the patient's selected caregivers. This information can only be distributed outside of the patient's care if the patient agrees and signs waivers of authorization for this information to be sent to an outside source or route.

## 2016-12-13 ENCOUNTER — Encounter (HOSPITAL_COMMUNITY): Payer: Self-pay | Admitting: *Deleted

## 2016-12-13 ENCOUNTER — Encounter (HOSPITAL_COMMUNITY)
Admission: RE | Disposition: A | Discharge status: Home / Self Care | Payer: Self-pay | Source: Ambulatory Visit | Attending: Urology

## 2016-12-13 ENCOUNTER — Ambulatory Visit (HOSPITAL_COMMUNITY)
Admission: RE | Admit: 2016-12-13 | Discharge: 2016-12-13 | Disposition: A | Discharge status: Home / Self Care | Payer: BLUE CROSS/BLUE SHIELD | Source: Ambulatory Visit | Attending: Urology | Admitting: Urology

## 2016-12-13 ENCOUNTER — Ambulatory Visit (HOSPITAL_COMMUNITY): Payer: BLUE CROSS/BLUE SHIELD

## 2016-12-13 DIAGNOSIS — F419 Anxiety disorder, unspecified: Secondary | ICD-10-CM | POA: Insufficient documentation

## 2016-12-13 DIAGNOSIS — K219 Gastro-esophageal reflux disease without esophagitis: Secondary | ICD-10-CM | POA: Diagnosis not present

## 2016-12-13 DIAGNOSIS — Z01818 Encounter for other preprocedural examination: Secondary | ICD-10-CM | POA: Diagnosis not present

## 2016-12-13 DIAGNOSIS — E039 Hypothyroidism, unspecified: Secondary | ICD-10-CM | POA: Diagnosis not present

## 2016-12-13 DIAGNOSIS — Z79899 Other long term (current) drug therapy: Secondary | ICD-10-CM | POA: Insufficient documentation

## 2016-12-13 DIAGNOSIS — N201 Calculus of ureter: Secondary | ICD-10-CM | POA: Insufficient documentation

## 2016-12-13 DIAGNOSIS — Z791 Long term (current) use of non-steroidal anti-inflammatories (NSAID): Secondary | ICD-10-CM | POA: Diagnosis not present

## 2016-12-13 HISTORY — DX: Depression, unspecified: F32.A

## 2016-12-13 HISTORY — DX: Major depressive disorder, single episode, unspecified: F32.9

## 2016-12-13 HISTORY — DX: Anxiety disorder, unspecified: F41.9

## 2016-12-13 HISTORY — DX: Personal history of urinary calculi: Z87.442

## 2016-12-13 HISTORY — PX: EXTRACORPOREAL SHOCK WAVE LITHOTRIPSY: SHX1557

## 2016-12-13 SURGERY — LITHOTRIPSY, ESWL
Anesthesia: LOCAL | Laterality: Left

## 2016-12-13 MED ORDER — PROMETHAZINE HCL 25 MG/ML IJ SOLN
6.2500 mg | Freq: Once | INTRAMUSCULAR | Status: AC
  Administered 2016-12-13: 6.25 mg via INTRAVENOUS
  Filled 2016-12-13: qty 1

## 2016-12-13 MED ORDER — ACETAMINOPHEN 10 MG/ML IV SOLN
1000.0000 mg | INTRAVENOUS | Status: AC
  Administered 2016-12-13: 1000 mg via INTRAVENOUS
  Filled 2016-12-13: qty 100

## 2016-12-13 MED ORDER — DIPHENHYDRAMINE HCL 25 MG PO CAPS
25.0000 mg | ORAL_CAPSULE | ORAL | Status: AC
  Administered 2016-12-13: 25 mg via ORAL
  Filled 2016-12-13: qty 1

## 2016-12-13 MED ORDER — CIPROFLOXACIN HCL 500 MG PO TABS
500.0000 mg | ORAL_TABLET | ORAL | Status: AC
  Administered 2016-12-13: 500 mg via ORAL
  Filled 2016-12-13: qty 1

## 2016-12-13 MED ORDER — HYDROMORPHONE HCL 1 MG/ML IJ SOLN
1.0000 mg | Freq: Once | INTRAMUSCULAR | Status: AC
  Administered 2016-12-13: 1 mg via INTRAVENOUS
  Filled 2016-12-13: qty 1

## 2016-12-13 MED ORDER — DIAZEPAM 5 MG PO TABS
10.0000 mg | ORAL_TABLET | ORAL | Status: AC
  Administered 2016-12-13: 10 mg via ORAL
  Filled 2016-12-13: qty 2

## 2016-12-13 MED ORDER — IBUPROFEN 600 MG PO TABS: 600.0000 mg | ORAL_TABLET | Freq: Four times a day (QID) | ORAL | 0 refills | Status: DC | PRN

## 2016-12-13 MED ORDER — TRAMADOL HCL 50 MG PO TABS
50.0000 mg | ORAL_TABLET | Freq: Once | ORAL | Status: AC
Start: 2016-12-13 — End: 2016-12-13
  Administered 2016-12-13: 50 mg via ORAL
  Filled 2016-12-13 (×2): qty 1

## 2016-12-13 MED ORDER — SODIUM CHLORIDE 0.9 % IV SOLN
INTRAVENOUS | Status: DC
  Administered 2016-12-13 (×2): via INTRAVENOUS

## 2016-12-13 NOTE — Op Note (Signed)
Left Proximal Stone - 8 mm   Left ESWL   Pt tolerated well. Stone fragmentation was visualized. She may need a staged procedure.

## 2016-12-13 NOTE — Interval H&P Note (Signed)
History and Physical Interval Note:  12/13/2016 7:43 AM  Jennifer EastSandra K Richard  has presented today for surgery, with the diagnosis of LEFT PROXIMAL STONE  The various methods of treatment have been discussed with the patient and family. After consideration of risks, benefits and other options for treatment, the patient has consented to  Procedure(s): LEFT EXTRACORPOREAL SHOCK WAVE LITHOTRIPSY (ESWL) (Left) as a surgical intervention .  The patient's history has been reviewed, patient examined, no change in status, stable for surgery. She has not seen a stone pass. Pain minimal. Calcification stable on KUB.  I have reviewed the patient's chart and labs.  Questions were answered to the patient's satisfaction.     Donielle Kaigler

## 2016-12-13 NOTE — Discharge Instructions (Signed)
Lithotripsy, Care After °Refer to this sheet in the next few weeks. These instructions provide you with information on caring for yourself after your procedure. Your health care provider may also give you more specific instructions. Your treatment has been planned according to current medical practices, but problems sometimes occur. Call your health care provider if you have any problems or questions after your procedure. °WHAT TO EXPECT AFTER THE PROCEDURE  °· Your urine may have a red tinge for a few days after treatment. Blood loss is usually minimal. °· You may have soreness in the back or flank area. This usually goes away after a few days. The procedure can cause blotches or bruises on the back where the pressure wave enters the skin. These marks usually cause only minimal discomfort and should disappear in a short time. °· Stone fragments should begin to pass within 24 hours of treatment. However, a delayed passage is not unusual. °· You may have pain, discomfort, and feel sick to your stomach (nauseated) when the crushed fragments of stone are passed down the tube from the kidney to the bladder. Stone fragments can pass soon after the procedure and may last for up to 4-8 weeks. °· A small number of patients may have severe pain when stone fragments are not able to pass, which leads to an obstruction. °· If your stone is greater than 1 inch (2.5 cm) in diameter or if you have multiple stones that have a combined diameter greater than 1 inch (2.5 cm), you may require more than one treatment. °· If you had a stent placed prior to your procedure, you may experience some discomfort, especially during urination. You may experience the pain or discomfort in your flank or back, or you may experience a sharp pain or discomfort at the base of your penis or in your lower abdomen. The discomfort usually lasts only a few minutes after urinating. °HOME CARE INSTRUCTIONS  °· Rest at home until you feel your energy  improving. °· Only take over-the-counter or prescription medicines for pain, discomfort, or fever as directed by your health care provider. Depending on the type of lithotripsy, you may need to take antibiotics and anti-inflammatory medicines for a few days. °· Drink enough water and fluids to keep your urine clear or pale yellow. This helps "flush" your kidneys. It helps pass any remaining pieces of stone and prevents stones from coming back. °· Most people can resume daily activities within 1-2 days after standard lithotripsy. It can take longer to recover from laser and percutaneous lithotripsy. °· Strain all urine through the provided strainer. Keep all particulate matter and stones for your health care provider to see. The stone may be as small as a grain of salt. It is very important to use the strainer each and every time you pass your urine. Any stones that are found can be sent to a medical lab for examination. °· Visit your health care provider for a follow-up appointment in a few weeks. Your doctor may remove your stent if you have one. Your health care provider will also check to see whether stone particles still remain. °SEEK MEDICAL CARE IF:  °· Your pain is not relieved by medicine. °· You have a lasting nauseous feeling. °· You feel there is too much blood in the urine. °· You develop persistent problems with frequent or painful urination that does not at least partially improve after 2 days following the procedure. °· You have a congested cough. °· You feel   lightheaded. °· You develop a rash or any other signs that might suggest an allergic problem. °· You develop any reaction or side effects to your medicine(s). °SEEK IMMEDIATE MEDICAL CARE IF:  °· You experience severe back or flank pain or both. °· You see nothing but blood when you urinate. °· You cannot pass any urine at all. °· You have a fever or shaking chills. °· You develop shortness of breath, difficulty breathing, or chest pain. °· You  develop vomiting that will not stop after 6-8 hours. °· You have a fainting episode. °This information is not intended to replace advice given to you by your health care provider. Make sure you discuss any questions you have with your health care provider. °Document Released: 11/07/2007 Document Revised: 07/09/2015 Document Reviewed: 05/03/2013 °Elsevier Interactive Patient Education © 2017 [employer]vier Inc. ° °Moderate Conscious Sedation, Adult, Care After °These instructions provide you with information about caring for yourself after your procedure. Your health care provider may also give you more specific instructions. Your treatment has been planned according to current medical practices, but problems sometimes occur. Call your health care provider if you have any problems or questions after your procedure. °What can I expect after the procedure? °After your procedure, it is common: °· To feel sleepy for several hours. °· To feel clumsy and have poor balance for several hours. °· To have poor judgment for several hours. °· To vomit if you eat too soon. °Follow these instructions at home: °For at least 24 hours after the procedure:  °· Do not: °¨ Participate in activities where you could fall or become injured. °¨ Drive. °¨ Use heavy machinery. °¨ Drink alcohol. °¨ Take sleeping pills or medicines that cause drowsiness. °¨ Make important decisions or sign legal documents. °¨ Take care of children on your own. °· Rest. °Eating and drinking °· Follow the diet recommended by your health care provider. °· If you vomit: °¨ Drink water, juice, or soup when you can drink without vomiting. °¨ Make sure you have little or no nausea before eating solid foods. °General instructions °· Have a responsible adult stay with you until you are awake and alert. °· Take over-the-counter and prescription medicines only as told by your health care provider. °· If you smoke, do not smoke without supervision. °· Keep all follow-up visits  as told by your health care provider. This is important. °Contact a health care provider if: °· You keep feeling nauseous or you keep vomiting. °· You feel light-headed. °· You develop a rash. °· You have a fever. °Get help right away if: °· You have trouble breathing. °This information is not intended to replace advice given to you by your health care provider. Make sure you discuss any questions you have with your health care provider. °Document Released: 08/08/2013 Document Revised: 03/22/2016 Document Reviewed: 02/07/2016 °Elsevier Interactive Patient Education © 2017 Elsevier Inc. ° ° °

## 2016-12-24 DIAGNOSIS — N201 Calculus of ureter: Secondary | ICD-10-CM | POA: Diagnosis not present

## 2017-01-25 ENCOUNTER — Encounter (HOSPITAL_COMMUNITY): Payer: Self-pay | Admitting: Urology

## 2017-04-18 DIAGNOSIS — J329 Chronic sinusitis, unspecified: Secondary | ICD-10-CM | POA: Diagnosis not present

## 2017-04-18 DIAGNOSIS — R5383 Other fatigue: Secondary | ICD-10-CM | POA: Diagnosis not present

## 2017-05-11 DIAGNOSIS — E7800 Pure hypercholesterolemia, unspecified: Secondary | ICD-10-CM | POA: Diagnosis not present

## 2017-05-11 DIAGNOSIS — E039 Hypothyroidism, unspecified: Secondary | ICD-10-CM | POA: Diagnosis not present

## 2017-05-11 DIAGNOSIS — R3129 Other microscopic hematuria: Secondary | ICD-10-CM | POA: Diagnosis not present

## 2017-05-11 DIAGNOSIS — R739 Hyperglycemia, unspecified: Secondary | ICD-10-CM | POA: Diagnosis not present

## 2017-05-11 DIAGNOSIS — R5383 Other fatigue: Secondary | ICD-10-CM | POA: Diagnosis not present

## 2017-05-18 DIAGNOSIS — M810 Age-related osteoporosis without current pathological fracture: Secondary | ICD-10-CM | POA: Diagnosis not present

## 2017-05-18 DIAGNOSIS — E785 Hyperlipidemia, unspecified: Secondary | ICD-10-CM | POA: Diagnosis not present

## 2017-05-18 DIAGNOSIS — E559 Vitamin D deficiency, unspecified: Secondary | ICD-10-CM | POA: Diagnosis not present

## 2017-05-18 DIAGNOSIS — K219 Gastro-esophageal reflux disease without esophagitis: Secondary | ICD-10-CM | POA: Diagnosis not present

## 2017-05-18 DIAGNOSIS — Z0000 Encounter for general adult medical examination without abnormal findings: Secondary | ICD-10-CM | POA: Diagnosis not present

## 2017-05-18 DIAGNOSIS — E039 Hypothyroidism, unspecified: Secondary | ICD-10-CM | POA: Diagnosis not present

## 2017-05-19 ENCOUNTER — Other Ambulatory Visit: Payer: Self-pay | Admitting: Internal Medicine

## 2017-05-19 DIAGNOSIS — L723 Sebaceous cyst: Secondary | ICD-10-CM

## 2017-07-05 ENCOUNTER — Encounter: Payer: Self-pay | Admitting: *Deleted

## 2017-07-06 ENCOUNTER — Encounter: Payer: Self-pay | Admitting: Diagnostic Neuroimaging

## 2017-07-06 ENCOUNTER — Ambulatory Visit (INDEPENDENT_AMBULATORY_CARE_PROVIDER_SITE_OTHER): Payer: BLUE CROSS/BLUE SHIELD | Admitting: Diagnostic Neuroimaging

## 2017-07-06 VITALS — BP 121/77 | HR 76 | Ht 66.0 in | Wt 171.6 lb

## 2017-07-06 DIAGNOSIS — G250 Essential tremor: Secondary | ICD-10-CM | POA: Diagnosis not present

## 2017-07-06 NOTE — Patient Instructions (Addendum)
Thank you for coming to see [place] at [facility]. I hope we have been able to provide you high quality care today.  You may receive a patient satisfaction survey over the next few weeks. We would appreciate your feedback and comments so that we may continue to improve ourselves and the health of our patients.  - monitor symptoms; may treat symptoms as needed in the future   ~~~~~~~~~~~~~~~~~~~~~~~~~~~~~~~~~~~~~~~~~~~~~~~~~~~~~~~~~~~~~~~~~  DR. Leveda Kendrix'S GUIDE TO HAPPY AND HEALTHY LIVING These are some of my general health and wellness recommendations. Some of them may apply to you better than others. Please use common sense as you try these suggestions and feel free to ask me any questions.   ACTIVITY/FITNESS Mental, social, emotional and physical stimulation are very important for brain and body health. Try learning a new activity (arts, music, language, sports, games).  Keep moving your body to the best of your abilities. You can do this at home, inside or outside, the park, community center, gym or anywhere you like. Consider a physical therapist or personal trainer to get started. Consider the app Sworkit. Fitness trackers such as smart-watches, smart-phones or Fitbits can help as well.   NUTRITION Eat more plants: colorful vegetables, nuts, seeds and berries.  Eat less sugar, salt, preservatives and processed foods.  Avoid toxins such as cigarettes and alcohol.  Drink water when you are thirsty. Warm water with a slice of lemon is an excellent morning drink to start the day.  Consider these websites for more information The Nutrition Source (https://www.henry-hernandez.biz/) Precision Nutrition (WindowBlog.ch)   RELAXATION Consider practicing mindfulness meditation or other relaxation techniques such as deep breathing, prayer, yoga, tai chi, massage. See website mindful.org or the apps Headspace or Calm to help get  started.   SLEEP Try to get at least 7-8+ hours sleep per day. Regular exercise and reduced caffeine will help you sleep better. Practice good sleep hygeine techniques. See website sleep.org for more information.   PLANNING Prepare estate planning, living will, healthcare POA documents. Sometimes this is best planned with the help of an attorney. Theconversationproject.org and agingwithdignity.org are excellent resources.

## 2017-07-06 NOTE — Progress Notes (Signed)
[facility]  PATIENT: Jennifer EastSandra K Richard DOB: 1956-07-12  REFERRING CLINICIAN: Pixie CasinoJ Kim HISTORY FROM: patient  REASON FOR VISIT: new consult    HISTORICAL  CHIEF COMPLAINT:  Chief Complaint  Patient presents with  . NP   Selena BattenKim  . Bilateral hand tremors    has had for several yrs.  Family hx of this as well.      HISTORY OF PRESENT ILLNESS:   61 year old female here for valuation of tremors. Patient has had gradual onset progressive tremors in her hands and had since age 61 years old. This was evaluated by PCP in the past and patient was diagnosed with benign essential tremor. Patient has family history of tremor in her sister (deceased), brother, as well as several maternal cousins. Symptoms affect her when she is holding certain objects. Her family has noticed tremor has gradually been increasing. She has had more tremor in the last 1 year. She's also had significant stress in the last 1 year related to the deaths of her sister and father within close timeframe.  Due to significant stress, anxiety and depression patient has been started on low-dose Lexapro in fall of 2017. Patient is wondering whether medication is contributing to her tremor.  Patient denies any resting tremor. She denies any stiffness in her muscles, gait or balance difficulties, numbness or tingling. No family history of Parkinson's disease.  REVIEW OF SYSTEMS: Full 14 system review of systems performed and negative with exception of: Tremor anxiety joint pain allergies IBS.  ALLERGIES: Allergies  Allergen Reactions  . Codeine Nausea And Vomiting and Swelling    HOME MEDICATIONS: Outpatient Medications Prior to Visit  Medication Sig Dispense Refill  . ALPRAZolam (XANAX) 0.25 MG tablet Take 0.25 mg by mouth 3 (three) times daily as needed for anxiety.    . Calcium Carbonate-Vit D-Min 600-200 MG-UNIT TABS Take 1 tablet by mouth daily.      Marland Kitchen. escitalopram (LEXAPRO) 5 MG tablet Take 5 mg by mouth  at bedtime.  4  . fenofibrate (TRICOR) 145 MG tablet Take 145 mg by mouth every other day.     . levothyroxine (SYNTHROID, LEVOTHROID) 112 MCG tablet Take 112 mcg by mouth daily before breakfast.     . omeprazole (PRILOSEC) 20 MG capsule TAKE ONE CAPSULE BY MOUTH TWICE DAILY  (Patient taking differently: TAKE 20 MG BY MOUTH TWICE DAILY AS NEEDE FOR REFLUX) 60 capsule 1  . acidophilus (RISAQUAD) CAPS capsule Take 1 capsule by mouth every morning.     . Biotin (BIOTIN 5000) 5 MG CAPS Take by mouth.    Marland Kitchen. ibuprofen (ADVIL,MOTRIN) 600 MG tablet Take 1 tablet (600 mg total) by mouth every 6 (six) hours as needed. (Patient not taking: Reported on 07/06/2017) 30 tablet 0  . multivitamin (THERAGRAN) per tablet Take 1 tablet by mouth daily.      . ondansetron (ZOFRAN) 4 MG tablet Take 1 tablet (4 mg total) by mouth every 6 (six) hours as needed for nausea or vomiting. (Patient not taking: Reported on 07/06/2017) 12 tablet 0  . oxyCODONE-acetaminophen (PERCOCET/ROXICET) 5-325 MG tablet Take 1-2 tablets by mouth every 6 (six) hours as needed for severe pain. (Patient not taking: Reported on 07/06/2017) 15 tablet 0  . tamsulosin (FLOMAX) 0.4 MG CAPS capsule Take 1 capsule (0.4 mg total) by mouth daily. (Patient not taking: Reported on 07/06/2017) 30 capsule 0  . traMADol (ULTRAM) 50 MG tablet Take 50 mg by mouth once.     No facility-administered medications  prior to visit.     PAST MEDICAL HISTORY: Past Medical History:  Diagnosis Date  . Anxiety   . CAD (coronary artery disease)    minimal plaque by cath 4/07: mLAD 30%, EF 60%  . Depression   . GERD (gastroesophageal reflux disease)   . Hepatic steatosis 03/08/12  . Hiatal hernia   . Hiatal hernia   . History of IBS   . History of kidney stones   . Hyperglycemia    Borderline  . Hyperlipidemia   . Hypothyroidism   . IBS (irritable bowel syndrome)   . Kidney stones   . Right thyroid nodule     s/p thyroid biopsy; report states follicular epitehlial  cells, histiocytes, colloid, and lymphocytes (thyroiditis). Suggest a non-neoplastic process such as a hyperplastic nodule/ non-neoplastic goiter in a background of thyroiditis. Thyroid biopsy was done under a thyroid ultrasound at [facility].  . SVT (supraventricular tachycardia) (HCC)    Hx of, s/p EP study with catheter ablation of AV nodal; re-entry tachycardia 01/10/06, s/p adenosine myoview, 10/06 showing an EF of 70% with no ischemia    PAST SURGICAL HISTORY: Past Surgical History:  Procedure Laterality Date  . ABLATION     SVT  . BIOPSY THYROID    . CARDIAC ELECTROPHYSIOLOGY MAPPING AND ABLATION  2007  . CESAREAN SECTION     x2 via lower midline  . EXTRACORPOREAL SHOCK WAVE LITHOTRIPSY Left 12/13/2016   Procedure: LEFT EXTRACORPOREAL SHOCK WAVE LITHOTRIPSY (ESWL);  Surgeon: Jerilee Field, MD;  Location: [facility];  Service: Urology;  Laterality: Left;  . LAPAROSCOPIC CHOLECYSTECTOMY W/ CHOLANGIOGRAPHY    . TOTAL THYROIDECTOMY  11/08/2007  . TUBAL LIGATION      FAMILY HISTORY: Family History  Problem Relation Age of Onset  . Coronary artery disease Mother 68  . Stroke Mother   . Heart attack Father 68       MI x2  . Bladder Cancer Father   . Dementia Father   . Dementia Sister        early onset frontal lobe  . Colon cancer Neg Hx     SOCIAL HISTORY:  Social History   Social History  . Marital status: Married    Spouse name: N/A  . Number of children: N/A  . Years of education: N/A   Occupational History  . Not employed outside the home    Social History Main Topics  . Smoking status: Never Smoker  . Smokeless tobacco: Never Used  . Alcohol use Yes     Comment: rare  . Drug use: No  . Sexual activity: Not on file   Other Topics Concern  . Not on file   Social History Narrative   Lives in [place] with husband   2 adult children   Follows a regular diet   No routine exercise program   Education College degree.     caffiene  2 glasses  of tea     PHYSICAL EXAM  GENERAL EXAM/CONSTITUTIONAL: Vitals:  Vitals:   07/06/17 1240  BP: 121/77  Pulse: 76  Weight: 171 lb 9.6 oz (77.8 kg)  Height: 5\' 6"  (1.676 m)     Body mass index is 27.7 kg/m.  No exam data present  Patient is in no distress; well developed, nourished and groomed; neck is supple  CARDIOVASCULAR:  Examination of carotid arteries is normal; no carotid bruits  Regular rate and rhythm, no murmurs  Examination of peripheral vascular system by observation and palpation is normal  EYES:  Ophthalmoscopic exam of optic discs and posterior segments is normal; no papilledema or hemorrhages  MUSCULOSKELETAL:  Gait, strength, tone, movements noted in Neurologic exam below  NEUROLOGIC: MENTAL STATUS:  No flowsheet data found.  awake, alert, oriented to person, place and time  recent and remote memory intact  normal attention and concentration  language fluent, comprehension intact, naming intact,   fund of knowledge appropriate  CRANIAL NERVE:   2nd - no papilledema on fundoscopic exam  2nd, 3rd, 4th, 6th - pupils equal and reactive to light, visual fields full to confrontation, extraocular muscles intact, no nystagmus  5th - facial sensation symmetric  7th - facial strength symmetric  8th - hearing intact  9th - palate elevates symmetrically, uvula midline  11th - shoulder shrug symmetric  12th - tongue protrusion midline  MOTOR:   MILD POSTURAL AND ACTION TREMOR IN BUE  MILD HEAD TREMOR  NO COGWHEELING RIGIDITY; NO BRADYKINESIA  normal bulk and tone, full strength in the BUE, BLE  SENSORY:   normal and symmetric to light touch,  temperature, vibration  COORDINATION:   finger-nose-finger, fine finger movements normal  REFLEXES:   deep tendon reflexes present and symmetric  GAIT/STATION:   narrow based gait; able to walk tandem    DIAGNOSTIC DATA (LABS, IMAGING, TESTING) - I reviewed patient records,  labs, notes, testing and imaging myself where available.  Lab Results  Component Value Date   WBC 12.4 (H) 12/03/2016   HGB 14.1 12/03/2016   HCT 42.6 12/03/2016   MCV 87.8 12/03/2016   PLT 319 12/03/2016      Component Value Date/Time   NA 138 12/03/2016 1713   K 3.7 12/03/2016 1713   CL 106 12/03/2016 1713   CO2 24 12/03/2016 1713   GLUCOSE 125 (H) 12/03/2016 1713   BUN 27 (H) 12/03/2016 1713   CREATININE 1.08 (H) 12/03/2016 1713   CALCIUM 9.1 12/03/2016 1713   PROT 7.9 05/08/2013 1700   ALBUMIN 4.3 05/08/2013 1700   AST 37 05/08/2013 1700   ALT 50 (H) 05/08/2013 1700   ALKPHOS 54 05/08/2013 1700   BILITOT 0.6 05/08/2013 1700   GFRNONAA 55 (L) 12/03/2016 1713   GFRAA >60 12/03/2016 1713   No results found for: CHOL, HDL, LDLCALC, LDLDIRECT, TRIG, CHOLHDL No results found for: ZOXW9U No results found for: VITAMINB12 Lab Results  Component Value Date   TSH 0.74 04/29/2011        ASSESSMENT AND PLAN  61 y.o. year old female here with mild gradual progressive postural and action tremor, with physical exam findings most consistent with benign essential tremor. Discussed options for additional testing and treatment. We may consider propranolol or primidone medications in the future. Could consider MRI of the brain and DAT scan if necessary.  For now patient is reassured and we'll monitor symptoms.    Dx: likely essential tremor  1. Essential tremor      PLAN:  - monitor symptoms; may treat symptoms as needed in the future  Return if symptoms worsen or fail to improve.    Suanne Marker, MD 07/06/2017, 1:41 PM Certified in Neurology, Neurophysiology and Neuroimaging  [facility] [address], [address] [place], [place] [address] [phone]

## 2017-08-03 DIAGNOSIS — L814 Other melanin hyperpigmentation: Secondary | ICD-10-CM | POA: Diagnosis not present

## 2017-08-03 DIAGNOSIS — D225 Melanocytic nevi of trunk: Secondary | ICD-10-CM | POA: Diagnosis not present

## 2017-08-03 DIAGNOSIS — D1801 Hemangioma of skin and subcutaneous tissue: Secondary | ICD-10-CM | POA: Diagnosis not present

## 2017-08-03 DIAGNOSIS — L821 Other seborrheic keratosis: Secondary | ICD-10-CM | POA: Diagnosis not present

## 2017-08-12 DIAGNOSIS — Z23 Encounter for immunization: Secondary | ICD-10-CM | POA: Diagnosis not present

## 2017-08-23 DIAGNOSIS — Z6828 Body mass index (BMI) 28.0-28.9, adult: Secondary | ICD-10-CM | POA: Diagnosis not present

## 2017-08-23 DIAGNOSIS — Z01419 Encounter for gynecological examination (general) (routine) without abnormal findings: Secondary | ICD-10-CM | POA: Diagnosis not present

## 2017-08-23 DIAGNOSIS — Z1231 Encounter for screening mammogram for malignant neoplasm of breast: Secondary | ICD-10-CM | POA: Diagnosis not present

## 2017-09-19 DIAGNOSIS — L309 Dermatitis, unspecified: Secondary | ICD-10-CM | POA: Diagnosis not present

## 2017-09-19 DIAGNOSIS — L578 Other skin changes due to chronic exposure to nonionizing radiation: Secondary | ICD-10-CM | POA: Diagnosis not present

## 2017-11-03 DIAGNOSIS — J069 Acute upper respiratory infection, unspecified: Secondary | ICD-10-CM | POA: Diagnosis not present

## 2017-11-14 DIAGNOSIS — E039 Hypothyroidism, unspecified: Secondary | ICD-10-CM | POA: Diagnosis not present

## 2017-11-14 DIAGNOSIS — R739 Hyperglycemia, unspecified: Secondary | ICD-10-CM | POA: Diagnosis not present

## 2017-11-14 DIAGNOSIS — E785 Hyperlipidemia, unspecified: Secondary | ICD-10-CM | POA: Diagnosis not present

## 2017-11-17 DIAGNOSIS — J40 Bronchitis, not specified as acute or chronic: Secondary | ICD-10-CM | POA: Diagnosis not present

## 2017-11-17 DIAGNOSIS — E039 Hypothyroidism, unspecified: Secondary | ICD-10-CM | POA: Diagnosis not present

## 2017-11-17 DIAGNOSIS — R739 Hyperglycemia, unspecified: Secondary | ICD-10-CM | POA: Diagnosis not present

## 2017-11-17 DIAGNOSIS — E785 Hyperlipidemia, unspecified: Secondary | ICD-10-CM | POA: Diagnosis not present

## 2018-02-23 DIAGNOSIS — I872 Venous insufficiency (chronic) (peripheral): Secondary | ICD-10-CM | POA: Diagnosis not present

## 2018-02-23 DIAGNOSIS — L219 Seborrheic dermatitis, unspecified: Secondary | ICD-10-CM | POA: Diagnosis not present

## 2018-02-23 DIAGNOSIS — L719 Rosacea, unspecified: Secondary | ICD-10-CM | POA: Diagnosis not present

## 2018-05-16 DIAGNOSIS — E039 Hypothyroidism, unspecified: Secondary | ICD-10-CM | POA: Diagnosis not present

## 2018-05-16 DIAGNOSIS — E785 Hyperlipidemia, unspecified: Secondary | ICD-10-CM | POA: Diagnosis not present

## 2018-05-16 DIAGNOSIS — R739 Hyperglycemia, unspecified: Secondary | ICD-10-CM | POA: Diagnosis not present

## 2018-05-16 DIAGNOSIS — Z0000 Encounter for general adult medical examination without abnormal findings: Secondary | ICD-10-CM | POA: Diagnosis not present

## 2018-05-16 DIAGNOSIS — E559 Vitamin D deficiency, unspecified: Secondary | ICD-10-CM | POA: Diagnosis not present

## 2018-05-22 DIAGNOSIS — K219 Gastro-esophageal reflux disease without esophagitis: Secondary | ICD-10-CM | POA: Diagnosis not present

## 2018-05-22 DIAGNOSIS — I471 Supraventricular tachycardia: Secondary | ICD-10-CM | POA: Diagnosis not present

## 2018-05-22 DIAGNOSIS — Z0000 Encounter for general adult medical examination without abnormal findings: Secondary | ICD-10-CM | POA: Diagnosis not present

## 2018-05-22 DIAGNOSIS — R739 Hyperglycemia, unspecified: Secondary | ICD-10-CM | POA: Diagnosis not present

## 2018-06-12 DIAGNOSIS — E785 Hyperlipidemia, unspecified: Secondary | ICD-10-CM | POA: Insufficient documentation

## 2018-06-12 DIAGNOSIS — K219 Gastro-esophageal reflux disease without esophagitis: Secondary | ICD-10-CM | POA: Insufficient documentation

## 2018-06-12 DIAGNOSIS — I471 Supraventricular tachycardia: Secondary | ICD-10-CM | POA: Insufficient documentation

## 2018-06-12 DIAGNOSIS — I2510 Atherosclerotic heart disease of native coronary artery without angina pectoris: Secondary | ICD-10-CM | POA: Insufficient documentation

## 2018-06-12 NOTE — Progress Notes (Signed)
Cardiology Office Note   Date:  06/14/2018   ID:  [name], DOB Mar 17, 1956, MRN [id number]  PCP:  Pearson Grippe, MD  Cardiologist:   No primary care provider on file. Referring:  Pearson Grippe, MD   Chief Complaint  Patient presents with  . Shortness of Breath      History of Present Illness: [name] is a 62 y.o. female who presents as a referral from Pearson Grippe, MD.    She had a cardiac cath with minimal coronary plaque in 2007.   She also had a past history of SVT s/p ablation in 2007 as well as WPW. Here to establish given her family history of heart disease. Reports intermittent palpitations since her ablation, which is unchanged currently. States she recently started exercising in the gym, doing treadmill, elliptical, and lifting weights with no issues with breathing or chest pain, pressure, or tightness. She also began a low glycemic index diet and lost about 8 lbs. She describes no PND, orthopnea, or leg swelling.   Past Medical History:  Diagnosis Date  . Anxiety   . CAD (coronary artery disease)    minimal plaque by cath 4/07: mLAD 30%, EF 60%  . Depression   . GERD (gastroesophageal reflux disease)   . Hepatic steatosis 03/08/12  . Hiatal hernia   . History of IBS   . History of kidney stones   . Hyperglycemia    Borderline  . Hyperlipidemia   . Hypothyroidism   . Right thyroid nodule     s/p thyroid biopsy; report states follicular epitehlial cells, histiocytes, colloid, and lymphocytes (thyroiditis). Suggest a non-neoplastic process such as a hyperplastic nodule/ non-neoplastic goiter in a background of thyroiditis. Thyroid biopsy was done under a thyroid ultrasound at [facility].  . SVT (supraventricular tachycardia) (HCC)    Hx of, s/p EP study with catheter ablation of AV nodal; re-entry tachycardia 01/10/06, s/p adenosine myoview, 10/06 showing an EF of 70% with no ischemia  . Wolff-Parkinson-White (WPW) pattern     Past Surgical History:    Procedure Laterality Date  . ABLATION     SVT  . BIOPSY THYROID    . CARDIAC ELECTROPHYSIOLOGY MAPPING AND ABLATION  2007  . CESAREAN SECTION     x2 via lower midline  . EXTRACORPOREAL SHOCK WAVE LITHOTRIPSY Left 12/13/2016   Procedure: LEFT EXTRACORPOREAL SHOCK WAVE LITHOTRIPSY (ESWL);  Surgeon: Jerilee Field, MD;  Location: [facility];  Service: Urology;  Laterality: Left;  . LAPAROSCOPIC CHOLECYSTECTOMY W/ CHOLANGIOGRAPHY    . TOTAL THYROIDECTOMY  11/08/2007  . TUBAL LIGATION       Current Outpatient Medications  Medication Sig Dispense Refill  . cetirizine (KLS ALLER-TEC) 10 MG tablet Take 10 mg by mouth daily.    . Cholecalciferol (VITAMIN D3) 2000 units TABS Take 2,000 Units by mouth every other day.    . escitalopram (LEXAPRO) 5 MG tablet Take 5 mg by mouth at bedtime.  4  . fenofibrate (TRICOR) 145 MG tablet Take 145 mg by mouth every other day.     . levothyroxine (SYNTHROID, LEVOTHROID) 112 MCG tablet Take 112 mcg by mouth daily before breakfast.     . ALPRAZolam (XANAX) 0.25 MG tablet Take 0.25 mg by mouth 3 (three) times daily as needed for anxiety.    . Calcium Carbonate-Vit D-Min 600-200 MG-UNIT TABS Take 1 tablet by mouth daily.      Marland Kitchen omeprazole (PRILOSEC) 20 MG capsule TAKE ONE CAPSULE BY MOUTH TWICE DAILY  (  Patient not taking: Reported on 06/14/2018) 60 capsule 1  . simvastatin (ZOCOR) 10 MG tablet   12   No current facility-administered medications for this visit.     Allergies:   Codeine    Social History:  The patient  reports that she has never smoked. She has never used smokeless tobacco. She reports that she drinks alcohol. She reports that she does not use drugs.   Family History:  The patient's family history includes Bladder Cancer in her father; Coronary artery disease (age of onset: 7160) in her mother; Dementia in her father and sister; Heart attack (age of onset: 7362) in her father; Stroke (age of onset: 5736) in her mother.    ROS:  Please see the  history of present illness.   Otherwise, review of systems are positive for dizziness, fatigue.   All other systems are reviewed and negative.    PHYSICAL EXAM: VS:  BP 110/78   Pulse 70   Ht 5\' 6"  (1.676 m)   Wt 170 lb 3.2 oz (77.2 kg)   BMI 27.47 kg/m  , BMI Body mass index is 27.47 kg/m. GENERAL:  Well appearing HEENT:  Pupils equal round and reactive, fundi not visualized, oral mucosa unremarkable NECK:  No jugular venous distention, waveform within normal limits, carotid upstroke brisk and symmetric, no bruits, no thyromegaly LYMPHATICS:  No cervical, inguinal adenopathy LUNGS:  Clear to auscultation bilaterally BACK:  No CVA tenderness CHEST:  Unremarkable HEART:  PMI not displaced or sustained,S1 and S2 within normal limits, no S3, no S4, no clicks, no rubs, no murmurs ABD:  Flat, positive bowel sounds normal in frequency in pitch, no bruits, no rebound, no guarding, no midline pulsatile mass, no hepatomegaly, no splenomegaly EXT:  2 plus pulses throughout, no edema, no cyanosis no clubbing SKIN:  No rashes no nodules NEURO:  Cranial nerves II through XII grossly intact, motor grossly intact throughout PSYCH:  Cognitively intact, oriented to person place and time    EKG:  EKG is ordered today. The ekg ordered today demonstrates normal sinus rhythm, rate 70, normal axis, shortened PR intervals.    Recent Labs: No results found for requested labs within last 8760 hours.    Lipid Panel No results found for: CHOL, TRIG, HDL, CHOLHDL, VLDL, LDLCALC, LDLDIRECT    Wt Readings from Last 3 Encounters:  06/14/18 170 lb 3.2 oz (77.2 kg)  07/06/17 171 lb 9.6 oz (77.8 kg)  12/09/16 174 lb (78.9 kg)      Other studies Reviewed: Additional studies/ records that were reviewed today include: outside referral records. Review of the above records demonstrates: WPW, SVT, lipid panel.  Please see elsewhere in the note.     ASSESSMENT AND PLAN:  SVT:  She is s/p ablation in  2007. She has been well controlled since. No further testing or management needed at this time.    FAMILY HISTORY OF CAD:  She has a significant family history, she would greatly benefit from a calcium CT score as well as POET (Plain Old Exercise Treadmill) if calcium noted on CT.   DYSLIPIDEMIA: LDL is 96, she is currently on Simvastatin 10 mg. Will consider changing after her CT calcium score. The patient is asked to make an attempt to improve diet with the Mediterranean and exercise patterns to aid in medical management of this problem.   Current medicines are reviewed at length with the patient today.  The patient does not have concerns regarding medicines.  The following changes  have been made:  no change  Labs/ tests ordered today include: EKG  Orders Placed This Encounter  Procedures  . CT CARDIAC SCORING  . EKG 12-Lead     Disposition:   FU with me in one year.    Signed, Rollene RotundaJames Mariya Mottley, MD  06/14/2018 5:16 PM    [facility]

## 2018-06-14 ENCOUNTER — Ambulatory Visit (INDEPENDENT_AMBULATORY_CARE_PROVIDER_SITE_OTHER): Payer: BLUE CROSS/BLUE SHIELD | Admitting: Cardiology

## 2018-06-14 ENCOUNTER — Encounter: Payer: Self-pay | Admitting: Cardiology

## 2018-06-14 VITALS — BP 110/78 | HR 70 | Ht 66.0 in | Wt 170.2 lb

## 2018-06-14 DIAGNOSIS — I2510 Atherosclerotic heart disease of native coronary artery without angina pectoris: Secondary | ICD-10-CM | POA: Diagnosis not present

## 2018-06-14 DIAGNOSIS — E785 Hyperlipidemia, unspecified: Secondary | ICD-10-CM

## 2018-06-14 NOTE — Patient Instructions (Signed)
Medication Instructions:  Continue current medication  If you need a refill on your cardiac medications before your next appointment, please call your pharmacy.  Labwork: None Ordered   Testing/Procedures: Your physician has requested that you have a Coronary Calcium Score. This test is done at our [address] [facility].   Follow-Up: Your physician wants you to follow-up in: 1 Year. You should receive a reminder letter in the mail two months in advance. If you do not receive a letter, please call our office to schedule your follow-up appointment.     Thank you for choosing [facility]!!

## 2018-06-21 ENCOUNTER — Inpatient Hospital Stay: Admission: RE | Admit: 2018-06-21 | Payer: BLUE CROSS/BLUE SHIELD | Source: Ambulatory Visit

## 2018-06-22 IMAGING — CR DG ABDOMEN 1V
2 series · 2 of 2 positions shown · non-contrast
Comparison: 12/06/2016 chest radiograph.

CLINICAL DATA: 60 y/o F; pre length lithotripsy of left ureteral
stone.

EXAM:
ABDOMEN - 1 VIEW

[t abdomen supine * (1 of 2)]
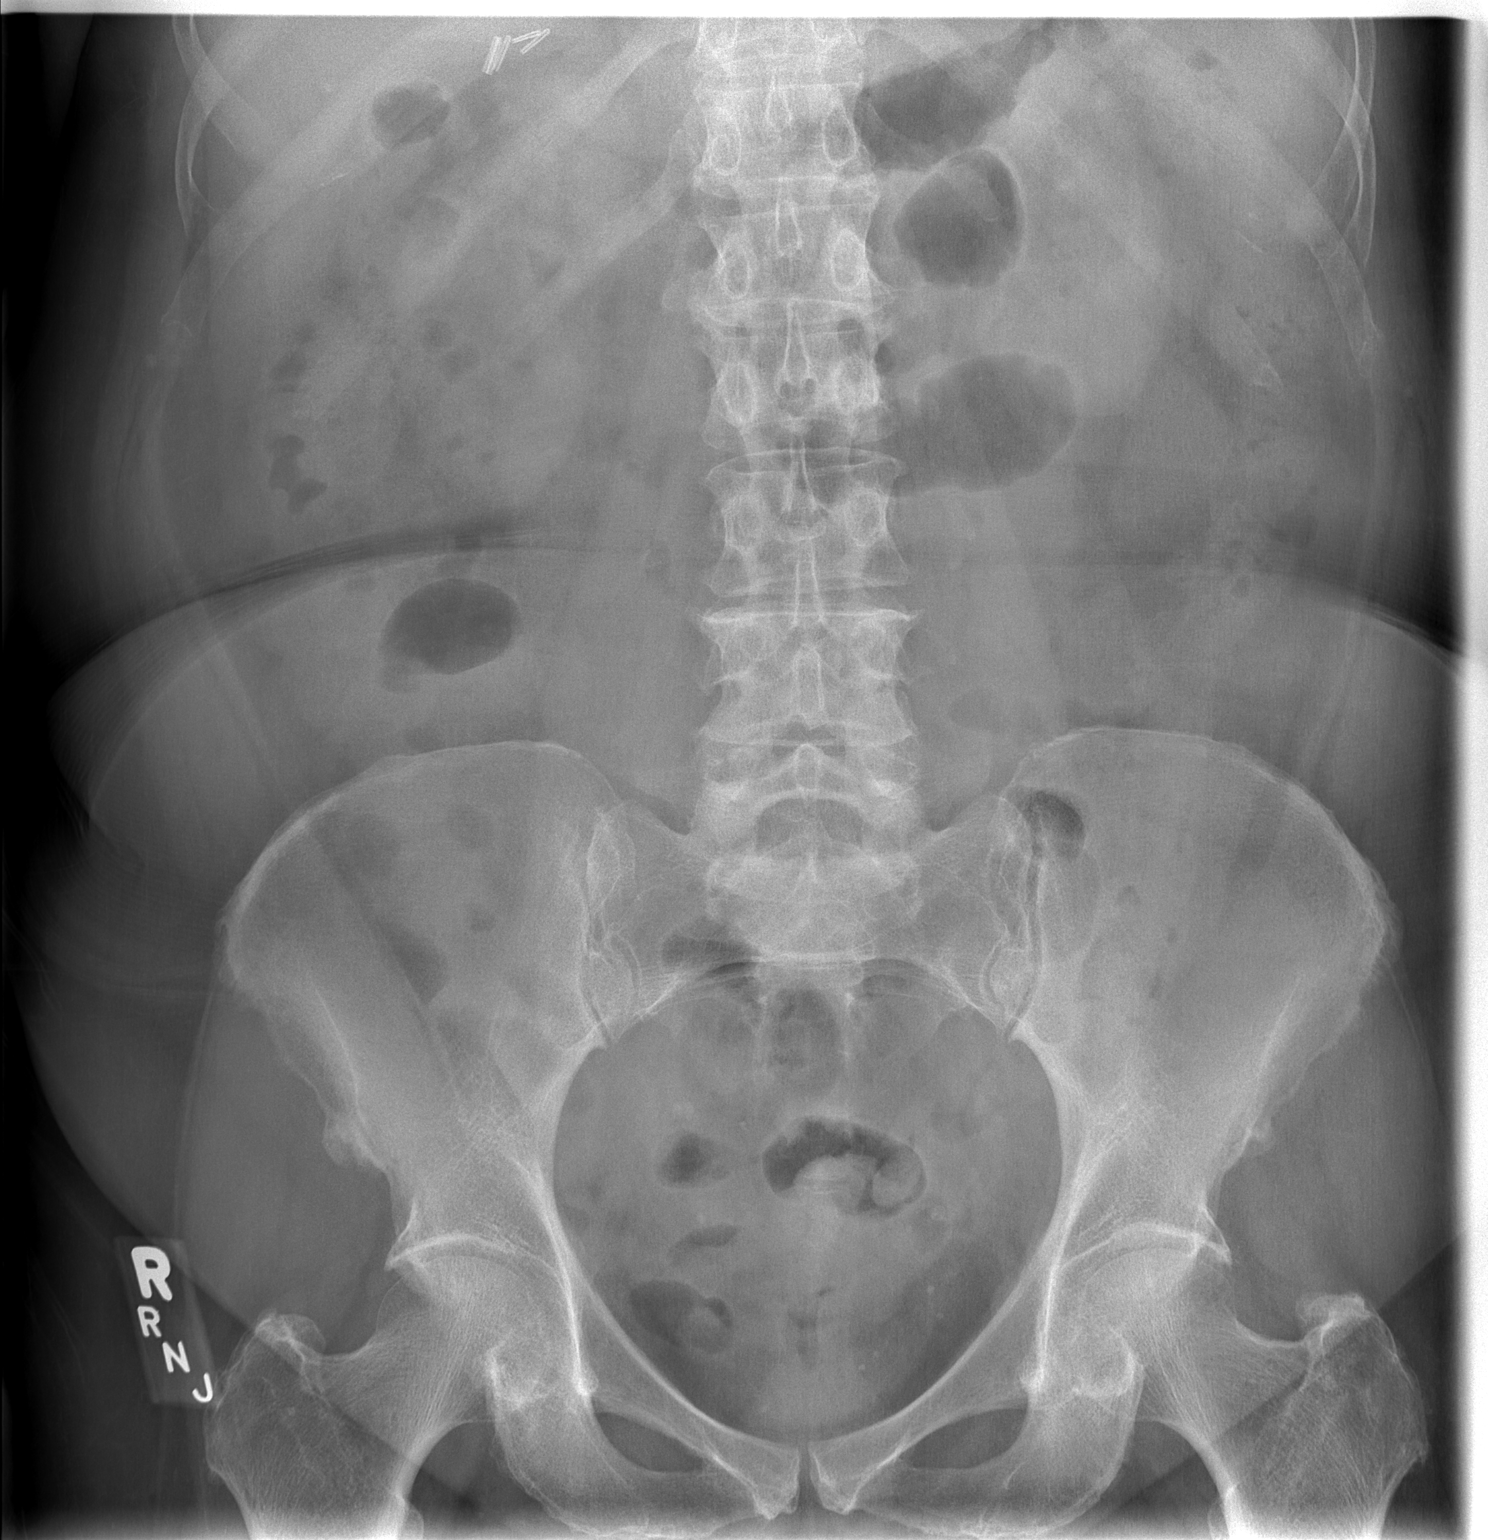

[t abdomen supine * (2 of 2)]
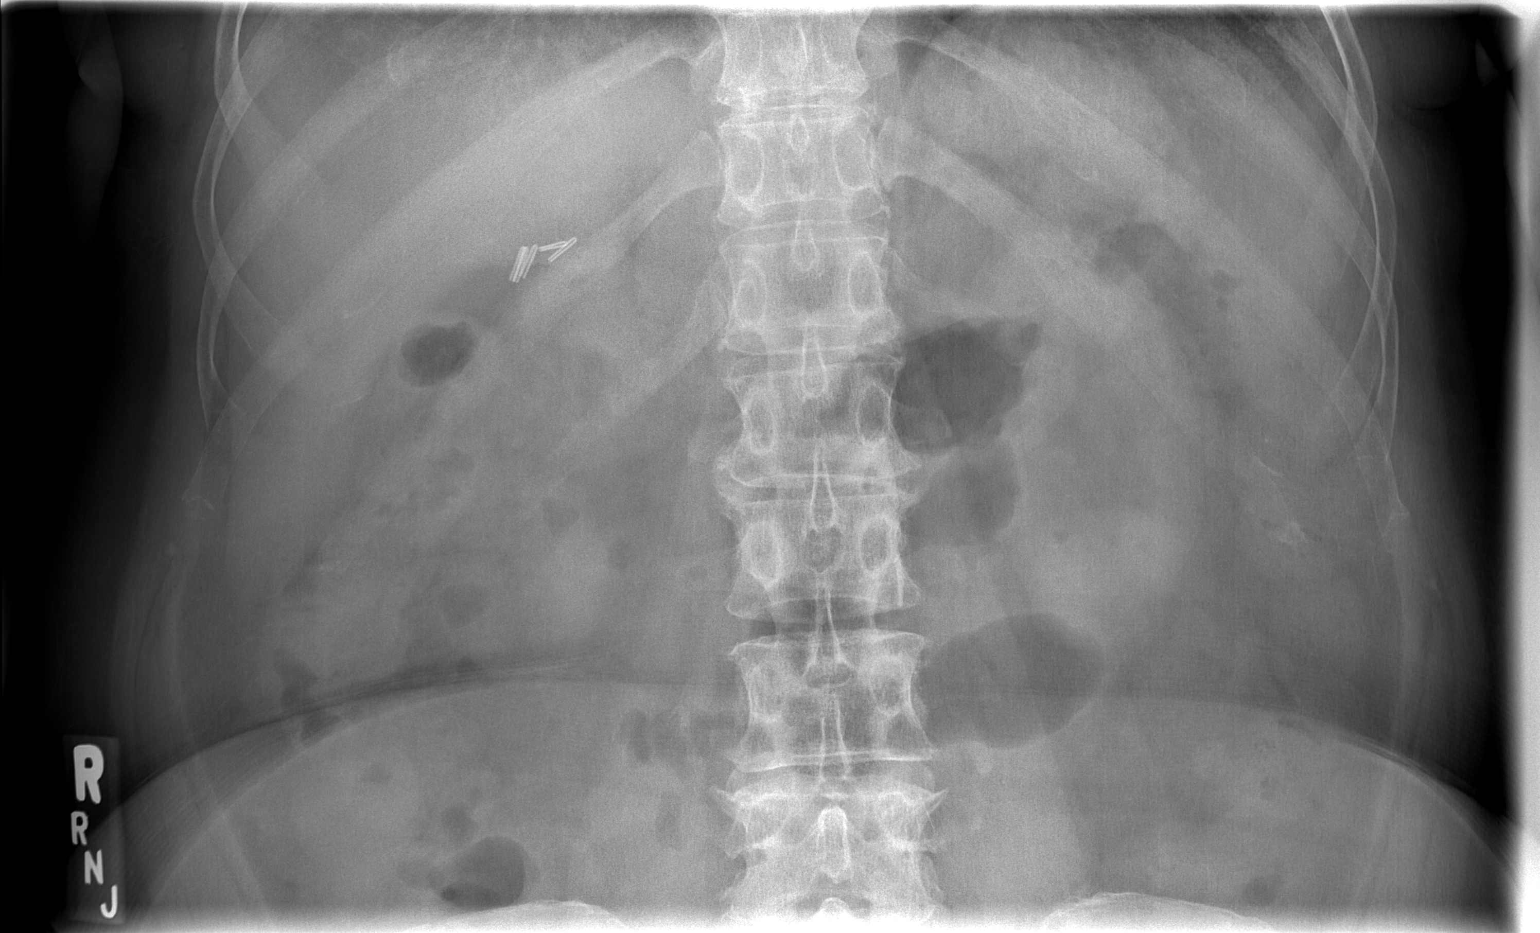

[2 of 2 positions shown; findings below may reference images not displayed]

FINDINGS: Normal bowel gas pattern. Right upper quadrant cholecystectomy
clips. Stable calcific density measuring up to 8 mm projecting over
the left psoas muscle at the L3 level. Kidneys are largely obscured
by bowel shadow.
IMPRESSION: Stable calcific density measuring up to 8 mm projecting over left
psoas muscle at the L3 level, possibly a ureteral stone.

By: Herky Danzot M.D.

## 2018-06-28 ENCOUNTER — Ambulatory Visit (INDEPENDENT_AMBULATORY_CARE_PROVIDER_SITE_OTHER)
Admission: RE | Admit: 2018-06-28 | Discharge: 2018-06-28 | Disposition: A | Discharge status: Home / Self Care | Payer: BLUE CROSS/BLUE SHIELD | Source: Ambulatory Visit | Attending: Cardiology | Admitting: Cardiology

## 2018-06-28 DIAGNOSIS — I2510 Atherosclerotic heart disease of native coronary artery without angina pectoris: Secondary | ICD-10-CM

## 2018-06-29 ENCOUNTER — Telehealth: Payer: Self-pay | Admitting: *Deleted

## 2018-06-29 DIAGNOSIS — R931 Abnormal findings on diagnostic imaging of heart and coronary circulation: Secondary | ICD-10-CM

## 2018-06-29 DIAGNOSIS — Z79899 Other long term (current) drug therapy: Secondary | ICD-10-CM

## 2018-06-29 DIAGNOSIS — E785 Hyperlipidemia, unspecified: Secondary | ICD-10-CM

## 2018-06-29 MED ORDER — ROSUVASTATIN CALCIUM 10 MG PO TABS: 10.0000 mg | ORAL_TABLET | Freq: Every day | ORAL | 3 refills | Status: DC

## 2018-06-29 NOTE — Telephone Encounter (Signed)
-----   Message from Rollene RotundaJames Hochrein, MD sent at 06/29/2018 10:36 AM EDT ----- Elevated calcium score.   I discussed this with the patient.  Needs a POET (Plain Old Exercise Treadmill).  Please schedule.  Please stop the Zocor and start Crestor 10 mg po once daily disp number [phone] with 3 refills.  Follow up lipids in  10 weeks and follow up with me in 6 months.  Send results to Pearson GrippeKim, James, MD

## 2018-06-29 NOTE — Telephone Encounter (Signed)
Order for GXT place. Rx has been sent to the pharmacy electronically. Crestor #[phone] refills. Fasting Lipid liver ordered and mail to pt.

## 2018-06-30 ENCOUNTER — Telehealth (HOSPITAL_COMMUNITY): Payer: Self-pay

## 2018-06-30 ENCOUNTER — Encounter: Payer: Self-pay | Admitting: Cardiology

## 2018-06-30 NOTE — Telephone Encounter (Signed)
Encounter complete. 

## 2018-07-05 ENCOUNTER — Ambulatory Visit (HOSPITAL_COMMUNITY)
Admission: RE | Admit: 2018-07-05 | Discharge: 2018-07-05 | Disposition: A | Discharge status: Home / Self Care | Payer: BLUE CROSS/BLUE SHIELD | Source: Ambulatory Visit | Attending: Cardiovascular Disease | Admitting: Cardiovascular Disease

## 2018-07-05 DIAGNOSIS — R931 Abnormal findings on diagnostic imaging of heart and coronary circulation: Secondary | ICD-10-CM

## 2018-07-05 LAB — EXERCISE TOLERANCE TEST
CHL CUP MPHR: 161 {beats}/min
CHL CUP RESTING HR STRESS: 71 {beats}/min
CSEPPHR: 155 {beats}/min
Estimated workload: 9.5 METS
Exercise duration (min): 7 min
Exercise duration (sec): 41 s
Percent HR: 96 %
RPE: 17

## 2018-07-28 DIAGNOSIS — Z23 Encounter for immunization: Secondary | ICD-10-CM | POA: Diagnosis not present

## 2018-08-03 IMAGING — US US RENAL
1 series · 14 of 25 positions shown · non-contrast
Comparison: Abdominal radiograph 12/03/2016, CT abdomen and pelvis
08/04/2016

CLINICAL DATA: LEFT nephrolithiasis, LEFT flank pain sudden onset,
prior history of stones

EXAM:
RENAL / URINARY TRACT ULTRASOUND COMPLETE

[Series 1: us renal · 0.26mm/px · 14 of 63 slices shown]
[im 1/63]
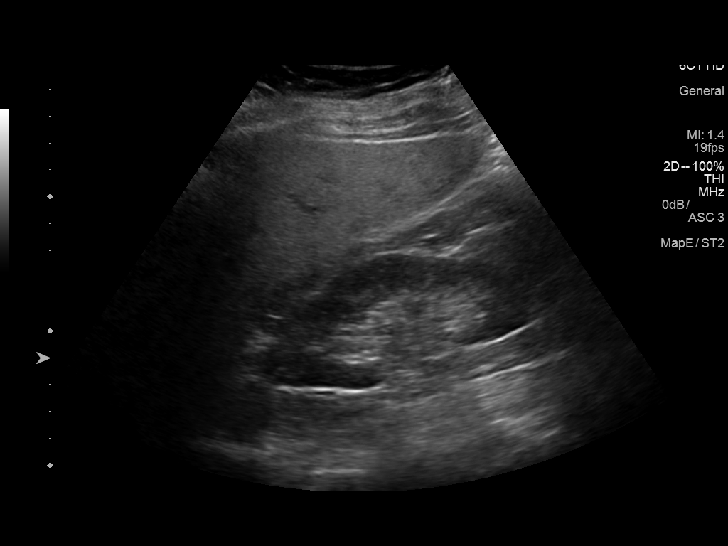
[im 6/63]
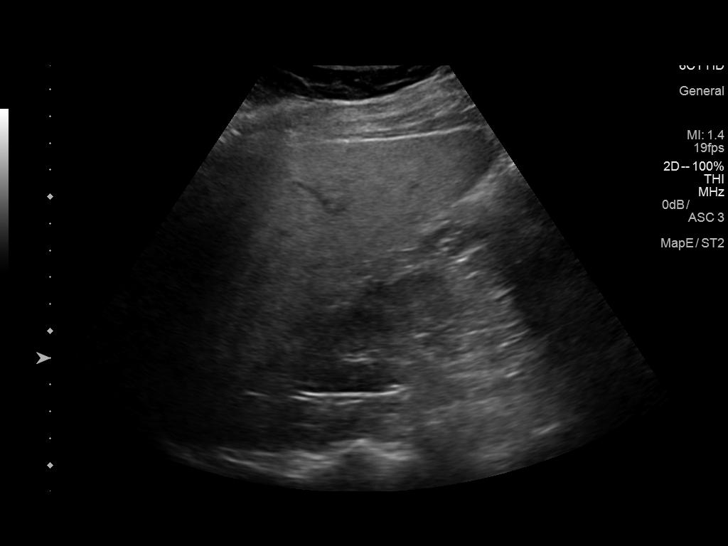
[im 11/63]
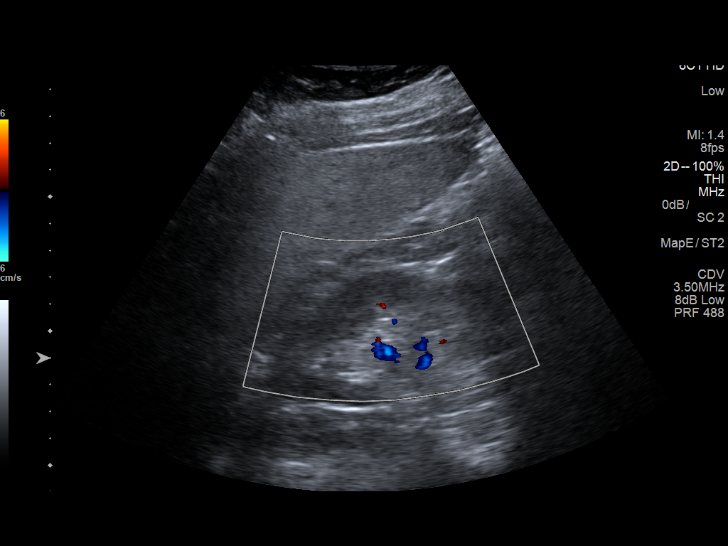
[im 16/63]
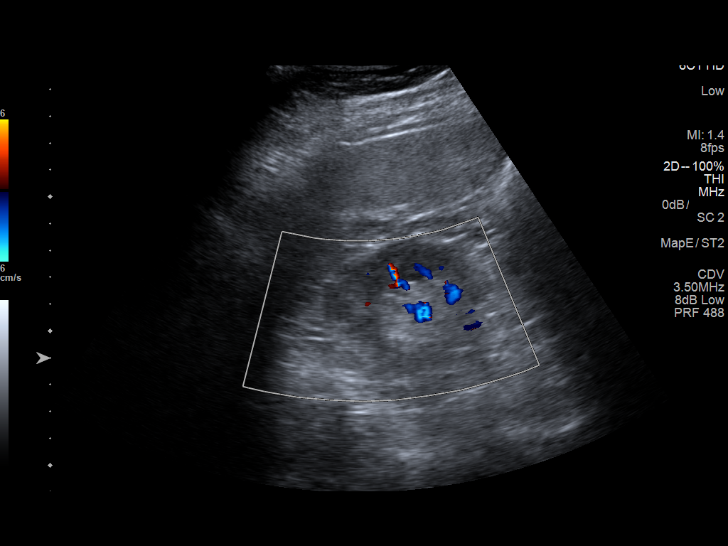
[im 21/63]
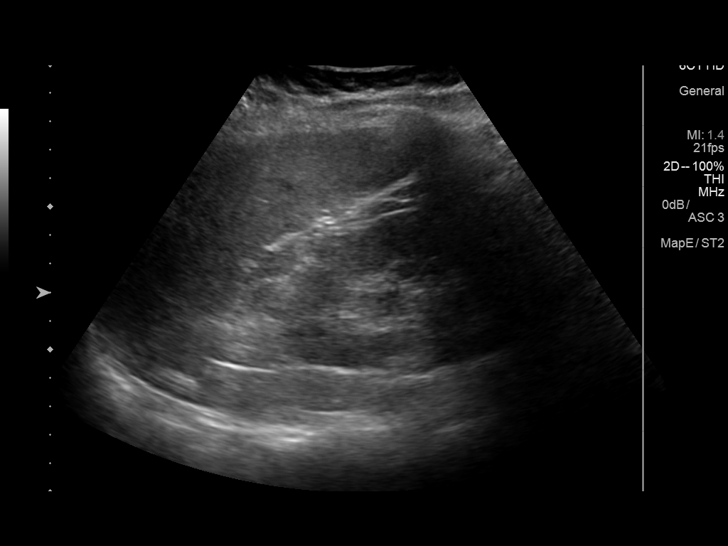
[im 24/63]
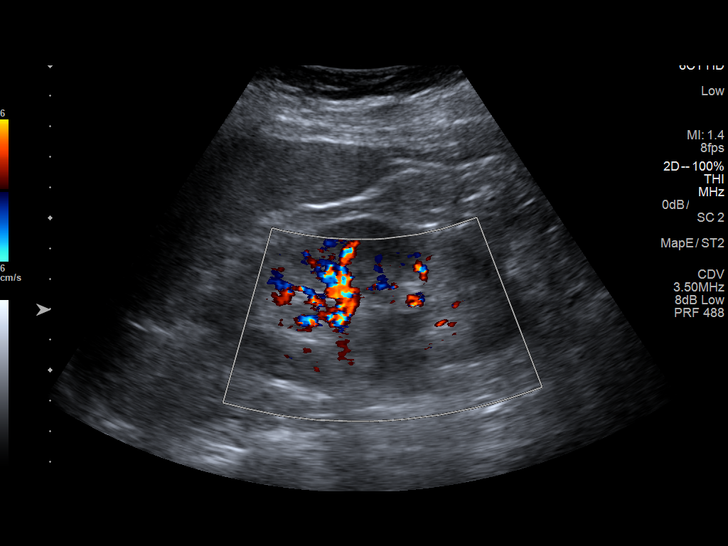
[im 29/63]
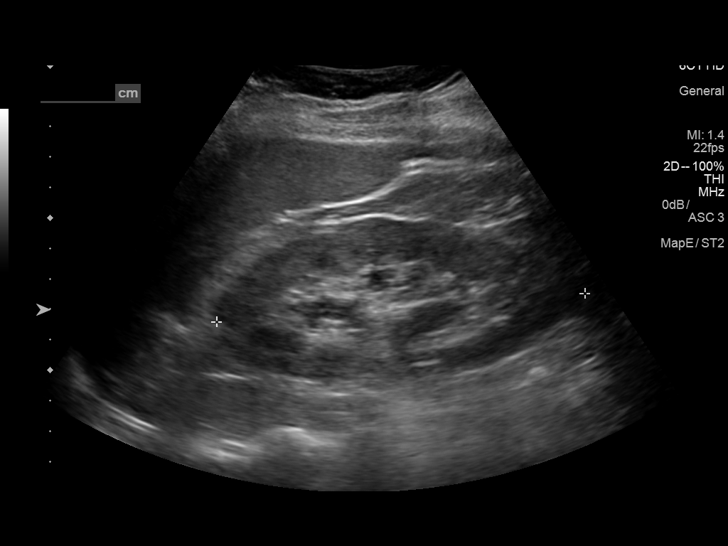
[im 34/63]
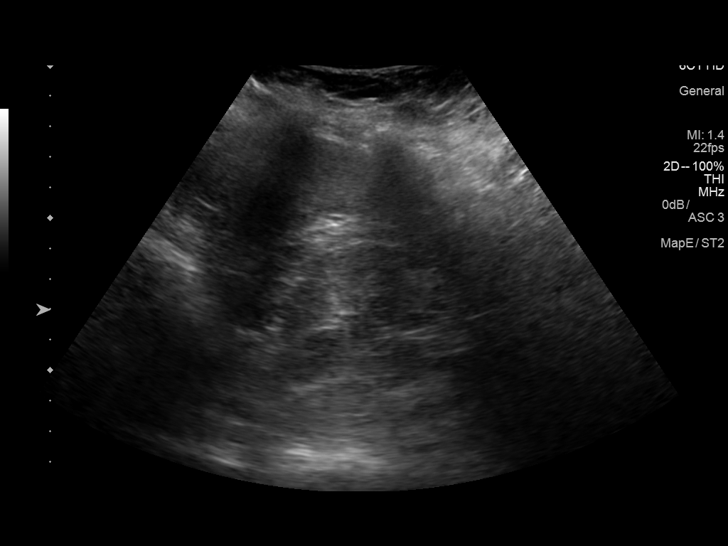
[im 39/63]
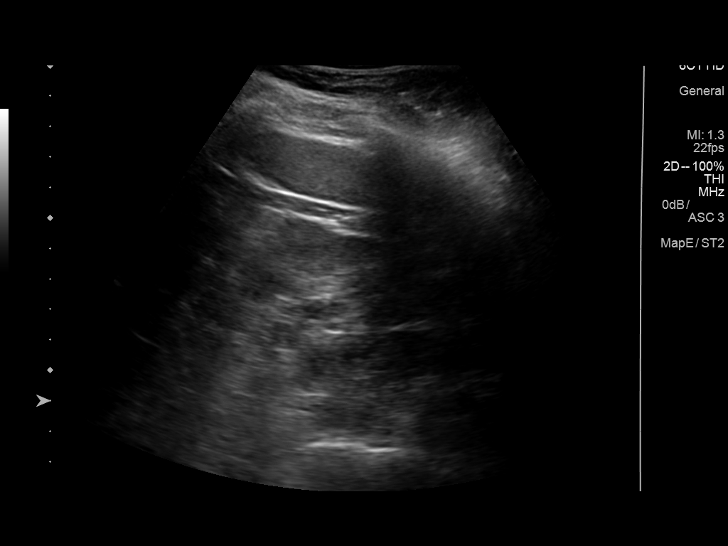
[im 42/63]
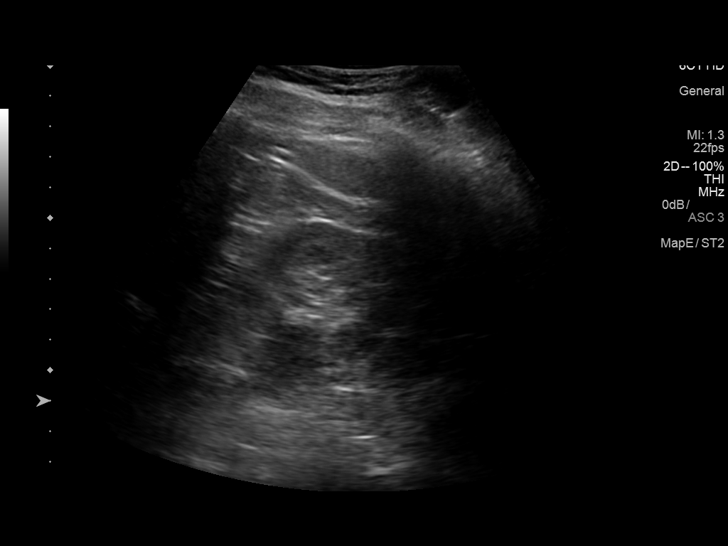
[im 47/63]
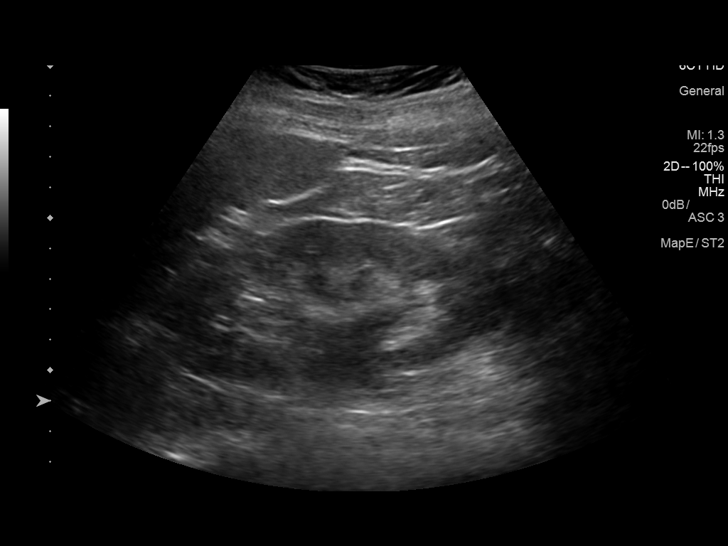
[im 52/63]
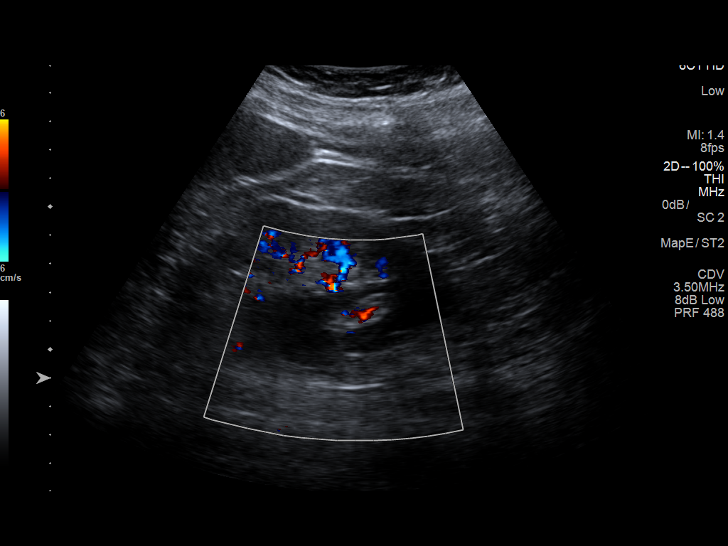
[im 57/63]
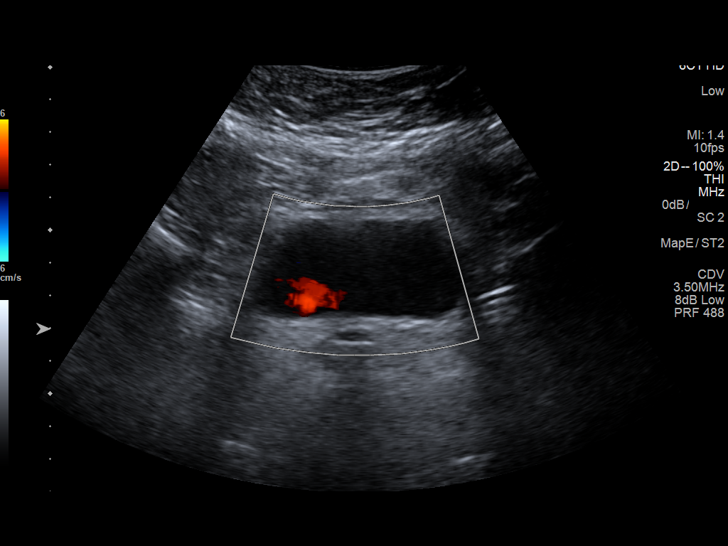
[im 63/63]
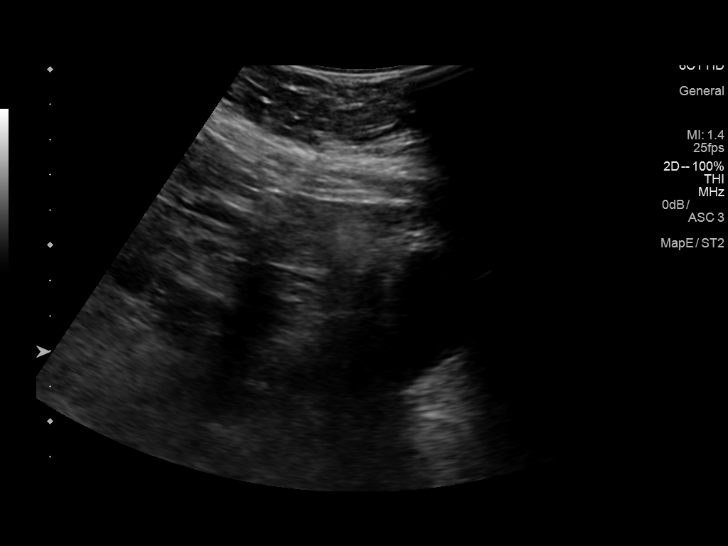

[14 of 25 positions shown; findings below may reference images not displayed]

FINDINGS: Right Kidney:

Length: 11.3 cm. Normal cortical thickness and echogenicity. No
mass, hydronephrosis or shadowing calcification.

Left Kidney:

Length: 12.1 cm. Normal cortical thickness. Upper normal cortical
echogenicity. Mild LEFT hydronephrosis. Proximal LEFT ureter is
mildly dilated

Bladder:

Partially distended, normal appearance. RIGHT ureteral jet
visualized. No LEFT ureteral jet seen.
IMPRESSION: LEFT hydronephrosis and proximal LEFT hydroureter.

Accompanying abdominal radiograph shows a proximal LEFT ureteral
calculus.

## 2018-08-07 DIAGNOSIS — D225 Melanocytic nevi of trunk: Secondary | ICD-10-CM | POA: Diagnosis not present

## 2018-08-07 DIAGNOSIS — L820 Inflamed seborrheic keratosis: Secondary | ICD-10-CM | POA: Diagnosis not present

## 2018-08-07 DIAGNOSIS — L814 Other melanin hyperpigmentation: Secondary | ICD-10-CM | POA: Diagnosis not present

## 2018-08-07 DIAGNOSIS — L570 Actinic keratosis: Secondary | ICD-10-CM | POA: Diagnosis not present

## 2018-08-07 DIAGNOSIS — L821 Other seborrheic keratosis: Secondary | ICD-10-CM | POA: Diagnosis not present

## 2018-08-29 DIAGNOSIS — E039 Hypothyroidism, unspecified: Secondary | ICD-10-CM | POA: Diagnosis not present

## 2018-08-29 DIAGNOSIS — R739 Hyperglycemia, unspecified: Secondary | ICD-10-CM | POA: Diagnosis not present

## 2018-08-29 DIAGNOSIS — E785 Hyperlipidemia, unspecified: Secondary | ICD-10-CM | POA: Diagnosis not present

## 2018-08-30 DIAGNOSIS — Z01419 Encounter for gynecological examination (general) (routine) without abnormal findings: Secondary | ICD-10-CM | POA: Diagnosis not present

## 2018-08-30 DIAGNOSIS — Z6827 Body mass index (BMI) 27.0-27.9, adult: Secondary | ICD-10-CM | POA: Diagnosis not present

## 2018-08-30 DIAGNOSIS — Z1231 Encounter for screening mammogram for malignant neoplasm of breast: Secondary | ICD-10-CM | POA: Diagnosis not present

## 2018-08-30 DIAGNOSIS — Z124 Encounter for screening for malignant neoplasm of cervix: Secondary | ICD-10-CM | POA: Diagnosis not present

## 2018-09-05 DIAGNOSIS — E7800 Pure hypercholesterolemia, unspecified: Secondary | ICD-10-CM | POA: Diagnosis not present

## 2018-09-05 DIAGNOSIS — E039 Hypothyroidism, unspecified: Secondary | ICD-10-CM | POA: Diagnosis not present

## 2019-01-11 DIAGNOSIS — L719 Rosacea, unspecified: Secondary | ICD-10-CM | POA: Diagnosis not present

## 2019-01-11 DIAGNOSIS — L820 Inflamed seborrheic keratosis: Secondary | ICD-10-CM | POA: Diagnosis not present

## 2019-04-17 ENCOUNTER — Telehealth: Payer: Self-pay | Admitting: *Deleted

## 2019-04-17 NOTE — Telephone Encounter (Signed)
A message was left, re: follow up visit. 

## 2019-05-14 DIAGNOSIS — Z0000 Encounter for general adult medical examination without abnormal findings: Secondary | ICD-10-CM | POA: Diagnosis not present

## 2019-05-14 DIAGNOSIS — E039 Hypothyroidism, unspecified: Secondary | ICD-10-CM | POA: Diagnosis not present

## 2019-05-14 DIAGNOSIS — E559 Vitamin D deficiency, unspecified: Secondary | ICD-10-CM | POA: Diagnosis not present

## 2019-05-14 DIAGNOSIS — E785 Hyperlipidemia, unspecified: Secondary | ICD-10-CM | POA: Diagnosis not present

## 2019-05-14 DIAGNOSIS — R739 Hyperglycemia, unspecified: Secondary | ICD-10-CM | POA: Diagnosis not present

## 2019-05-14 DIAGNOSIS — I10 Essential (primary) hypertension: Secondary | ICD-10-CM | POA: Diagnosis not present

## 2019-05-21 DIAGNOSIS — Z0000 Encounter for general adult medical examination without abnormal findings: Secondary | ICD-10-CM | POA: Diagnosis not present

## 2019-05-21 DIAGNOSIS — E039 Hypothyroidism, unspecified: Secondary | ICD-10-CM | POA: Diagnosis not present

## 2019-05-21 DIAGNOSIS — E785 Hyperlipidemia, unspecified: Secondary | ICD-10-CM | POA: Diagnosis not present

## 2019-05-21 DIAGNOSIS — F419 Anxiety disorder, unspecified: Secondary | ICD-10-CM | POA: Diagnosis not present

## 2019-05-21 DIAGNOSIS — Z23 Encounter for immunization: Secondary | ICD-10-CM | POA: Diagnosis not present

## 2019-06-01 ENCOUNTER — Other Ambulatory Visit: Payer: Self-pay | Admitting: Cardiology

## 2019-06-26 ENCOUNTER — Other Ambulatory Visit: Payer: Self-pay

## 2019-06-26 DIAGNOSIS — R112 Nausea with vomiting, unspecified: Secondary | ICD-10-CM | POA: Diagnosis not present

## 2019-06-26 DIAGNOSIS — Z20822 Contact with and (suspected) exposure to covid-19: Secondary | ICD-10-CM

## 2019-06-26 DIAGNOSIS — I10 Essential (primary) hypertension: Secondary | ICD-10-CM | POA: Diagnosis not present

## 2019-06-26 DIAGNOSIS — G43909 Migraine, unspecified, not intractable, without status migrainosus: Secondary | ICD-10-CM | POA: Diagnosis not present

## 2019-06-28 LAB — NOVEL CORONAVIRUS, NAA: SARS-CoV-2, NAA: NOT DETECTED

## 2019-08-03 DIAGNOSIS — Z23 Encounter for immunization: Secondary | ICD-10-CM | POA: Diagnosis not present

## 2019-08-13 ENCOUNTER — Telehealth: Payer: Self-pay | Admitting: *Deleted

## 2019-08-13 DIAGNOSIS — L821 Other seborrheic keratosis: Secondary | ICD-10-CM | POA: Diagnosis not present

## 2019-08-13 DIAGNOSIS — L820 Inflamed seborrheic keratosis: Secondary | ICD-10-CM | POA: Diagnosis not present

## 2019-08-13 DIAGNOSIS — L570 Actinic keratosis: Secondary | ICD-10-CM | POA: Diagnosis not present

## 2019-08-13 DIAGNOSIS — L814 Other melanin hyperpigmentation: Secondary | ICD-10-CM | POA: Diagnosis not present

## 2019-08-13 DIAGNOSIS — D225 Melanocytic nevi of trunk: Secondary | ICD-10-CM | POA: Diagnosis not present

## 2019-08-13 NOTE — Telephone Encounter (Signed)
A message was left, re: follow up visit. 

## 2019-08-20 ENCOUNTER — Telehealth: Payer: Self-pay | Admitting: *Deleted

## 2019-08-20 NOTE — Telephone Encounter (Signed)
A message was left, re: her follow up visit. 

## 2019-08-27 DIAGNOSIS — Z1231 Encounter for screening mammogram for malignant neoplasm of breast: Secondary | ICD-10-CM | POA: Diagnosis not present

## 2019-09-05 DIAGNOSIS — Z6828 Body mass index (BMI) 28.0-28.9, adult: Secondary | ICD-10-CM | POA: Diagnosis not present

## 2019-09-05 DIAGNOSIS — Z01419 Encounter for gynecological examination (general) (routine) without abnormal findings: Secondary | ICD-10-CM | POA: Diagnosis not present

## 2019-09-12 DIAGNOSIS — R931 Abnormal findings on diagnostic imaging of heart and coronary circulation: Secondary | ICD-10-CM | POA: Insufficient documentation

## 2019-09-12 DIAGNOSIS — E785 Hyperlipidemia, unspecified: Secondary | ICD-10-CM | POA: Insufficient documentation

## 2019-09-12 NOTE — Progress Notes (Signed)
Cardiology Office Note   Date:  09/13/2019   ID:  [name], [name] 25, 1957, MRN [id number]  PCP:  Pearson Grippe, MD  Cardiologist:   No primary care provider on file. Referring:  Pearson Grippe, MD   Chief Complaint  Patient presents with  . Coronary Calcium      History of Present Illness: [name] is a 63 y.o. female who presents as a referral from Pearson Grippe, MD.    She had a cardiac cath with minimal coronary plaque in 2007.   She also had a past history of SVT s/p ablation in 2007 as well as WPW.   She was found to have elevated coronary calcium.  Since I last saw her she has done well.  She is not exercising as much as she was because of the pandemic. The patient denies any new symptoms such as chest discomfort, neck or arm discomfort. There has been no new shortness of breath, PND or orthopnea. There have been no reported palpitations, presyncope or syncope.    Past Medical History:  Diagnosis Date  . Anxiety   . CAD (coronary artery disease)    minimal plaque by cath 4/07: mLAD 30%, EF 60%  . Depression   . GERD (gastroesophageal reflux disease)   . Hepatic steatosis 03/08/12  . Hiatal hernia   . History of IBS   . History of kidney stones   . Hyperglycemia    Borderline  . Hyperlipidemia   . Hypothyroidism   . Right thyroid nodule     s/p thyroid biopsy; report states follicular epitehlial cells, histiocytes, colloid, and lymphocytes (thyroiditis). Suggest a non-neoplastic process such as a hyperplastic nodule/ non-neoplastic goiter in a background of thyroiditis. Thyroid biopsy was done under a thyroid ultrasound at [facility].  . SVT (supraventricular tachycardia) (HCC)    Hx of, s/p EP study with catheter ablation of AV nodal; re-entry tachycardia 01/10/06, s/p adenosine myoview, 10/06 showing an EF of 70% with no ischemia  . Wolff-Parkinson-White (WPW) pattern     Past Surgical History:  Procedure Laterality Date  . ABLATION     SVT  .  BIOPSY THYROID    . CARDIAC ELECTROPHYSIOLOGY MAPPING AND ABLATION  2007  . CESAREAN SECTION     x2 via lower midline  . EXTRACORPOREAL SHOCK WAVE LITHOTRIPSY Left 12/13/2016   Procedure: LEFT EXTRACORPOREAL SHOCK WAVE LITHOTRIPSY (ESWL);  Surgeon: Jerilee Field, MD;  Location: [facility];  Service: Urology;  Laterality: Left;  . LAPAROSCOPIC CHOLECYSTECTOMY W/ CHOLANGIOGRAPHY    . TOTAL THYROIDECTOMY  11/08/2007  . TUBAL LIGATION       Current Outpatient Medications  Medication Sig Dispense Refill  . Calcium Carbonate-Vit D-Min 600-200 MG-UNIT TABS Take 1 tablet by mouth daily.      . cetirizine (KLS ALLER-TEC) 10 MG tablet Take 10 mg by mouth daily.    . Cholecalciferol (VITAMIN D3) 2000 units TABS Take 2,000 Units by mouth every other day.    . escitalopram (LEXAPRO) 5 MG tablet Take 5 mg by mouth at bedtime.  4  . fenofibrate (TRICOR) 145 MG tablet Take 145 mg by mouth every other day.     . levothyroxine (SYNTHROID, LEVOTHROID) 112 MCG tablet Take 112 mcg by mouth daily before breakfast.     . rosuvastatin (CRESTOR) 10 MG tablet TAKE 1 TABLET(10 MG) BY MOUTH DAILY 90 tablet 3   No current facility-administered medications for this visit.     Allergies:   Codeine  ROS:  Please see the history of present illness.   Otherwise, review of systems are positive for none.   All other systems are reviewed and negative.    PHYSICAL EXAM: VS:  BP 94/64   Pulse 80   Ht 5\' 6"  (1.676 m)   Wt 174 lb 6.4 oz (79.1 kg)   SpO2 97%   BMI 28.15 kg/m  , BMI Body mass index is 28.15 kg/m. GENERAL:  Well appearing NECK:  No jugular venous distention, waveform within normal limits, carotid upstroke brisk and symmetric, no bruits, no thyromegaly LUNGS:  Clear to auscultation bilaterally CHEST:  Unremarkable HEART:  PMI not displaced or sustained,S1 and S2 within normal limits, no S3, no S4, no clicks, no rubs, no murmurs ABD:  Flat, positive bowel sounds normal in frequency in pitch, no  bruits, no rebound, no guarding, no midline pulsatile mass, no hepatomegaly, no splenomegaly EXT:  2 plus pulses throughout, no edema, no cyanosis no clubbing NEURO:  Resting tremor.     EKG:  EKG is not ordered today.    Recent Labs: No results found for requested labs within last 8760 hours.    Lipid Panel No results found for: CHOL, TRIG, HDL, CHOLHDL, VLDL, LDLCALC, LDLDIRECT    Wt Readings from Last 3 Encounters:  09/13/19 174 lb 6.4 oz (79.1 kg)  06/14/18 170 lb 3.2 oz (77.2 kg)  07/06/17 171 lb 9.6 oz (77.8 kg)      Other studies Reviewed: Additional studies/ records that were reviewed today include: Labs Review of the above records demonstrates:  NA  ASSESSMENT AND PLAN:  SVT:    Ablation was curative.   FAMILY HISTORY OF CAD:   She had elevated coronary calcium score but a negative POET (Plain Old Exercise Treadmill) last year.  I would likely order a POET (Plain Old Exercise Treadmill) next year.  Of note her MESA score was 7.5.  It is reasonable for her to take a baby aspirin based on this.   DYSLIPIDEMIA: LDL is 67 with an HDL of 25.  She will continue on the meds as listed.   Current medicines are reviewed at length with the patient today.  The patient does not have concerns regarding medicines.  The following changes have been made:  None  Labs/ tests ordered today include:  None  No orders of the defined types were placed in this encounter.    Disposition:   FU with me in one year.    Signed, Minus Breeding, MD  09/13/2019 12:52 PM    [facility]

## 2019-09-13 ENCOUNTER — Ambulatory Visit (INDEPENDENT_AMBULATORY_CARE_PROVIDER_SITE_OTHER): Payer: BC Managed Care – PPO | Admitting: Cardiology

## 2019-09-13 ENCOUNTER — Encounter: Payer: Self-pay | Admitting: Cardiology

## 2019-09-13 ENCOUNTER — Other Ambulatory Visit: Payer: Self-pay

## 2019-09-13 VITALS — BP 94/64 | HR 80 | Ht 66.0 in | Wt 174.4 lb

## 2019-09-13 DIAGNOSIS — E785 Hyperlipidemia, unspecified: Secondary | ICD-10-CM | POA: Diagnosis not present

## 2019-09-13 DIAGNOSIS — R931 Abnormal findings on diagnostic imaging of heart and coronary circulation: Secondary | ICD-10-CM | POA: Diagnosis not present

## 2019-09-13 NOTE — Patient Instructions (Signed)
Medication Instructions:  Start taking 81mg  Aspirin Daily.   If you need a refill on your cardiac medications before your next appointment, please call your pharmacy.   Lab work: NONE  Testing/Procedures: NONE  Follow-Up: At [facility], you and your health needs are our priority.  As part of our continuing mission to provide you with exceptional heart care, we have created designated Provider Care Teams.  These Care Teams include your primary Cardiologist (physician) and Advanced Practice Providers (APPs -  Physician Assistants and Nurse Practitioners) who all work together to provide you with the care you need, when you need it. You may see Dr Percival Spanish or one of the following Advanced Practice Providers on your designated Care Team:    Rosaria Ferries, PA-C  Jory Sims, DNP, ANP  Cadence Kathlen Mody, NP  Your physician wants you to follow-up in: 1 year. You will receive a reminder letter in the mail two months in advance. If you don't receive a letter, please call our office to schedule the follow-up appointment.

## 2019-11-14 DIAGNOSIS — I10 Essential (primary) hypertension: Secondary | ICD-10-CM | POA: Diagnosis not present

## 2019-11-14 DIAGNOSIS — E559 Vitamin D deficiency, unspecified: Secondary | ICD-10-CM | POA: Diagnosis not present

## 2019-11-14 DIAGNOSIS — R739 Hyperglycemia, unspecified: Secondary | ICD-10-CM | POA: Diagnosis not present

## 2019-11-14 DIAGNOSIS — E039 Hypothyroidism, unspecified: Secondary | ICD-10-CM | POA: Diagnosis not present

## 2019-11-21 DIAGNOSIS — I10 Essential (primary) hypertension: Secondary | ICD-10-CM | POA: Diagnosis not present

## 2019-11-21 DIAGNOSIS — E785 Hyperlipidemia, unspecified: Secondary | ICD-10-CM | POA: Diagnosis not present

## 2019-11-21 DIAGNOSIS — Z23 Encounter for immunization: Secondary | ICD-10-CM | POA: Diagnosis not present

## 2019-11-21 DIAGNOSIS — E039 Hypothyroidism, unspecified: Secondary | ICD-10-CM | POA: Diagnosis not present

## 2019-11-30 ENCOUNTER — Ambulatory Visit: Payer: BC Managed Care – PPO

## 2019-12-06 ENCOUNTER — Ambulatory Visit: Payer: BC Managed Care – PPO

## 2019-12-21 ENCOUNTER — Ambulatory Visit: Payer: BC Managed Care – PPO

## 2020-03-12 DIAGNOSIS — I10 Essential (primary) hypertension: Secondary | ICD-10-CM | POA: Diagnosis not present

## 2020-03-12 DIAGNOSIS — E039 Hypothyroidism, unspecified: Secondary | ICD-10-CM | POA: Diagnosis not present

## 2020-03-12 DIAGNOSIS — R739 Hyperglycemia, unspecified: Secondary | ICD-10-CM | POA: Diagnosis not present

## 2020-03-21 DIAGNOSIS — E785 Hyperlipidemia, unspecified: Secondary | ICD-10-CM | POA: Diagnosis not present

## 2020-03-21 DIAGNOSIS — E039 Hypothyroidism, unspecified: Secondary | ICD-10-CM | POA: Diagnosis not present

## 2020-03-21 DIAGNOSIS — F419 Anxiety disorder, unspecified: Secondary | ICD-10-CM | POA: Diagnosis not present

## 2020-05-24 ENCOUNTER — Other Ambulatory Visit: Payer: Self-pay | Admitting: Cardiology

## 2021-02-23 ENCOUNTER — Telehealth: Payer: Self-pay | Admitting: Cardiology

## 2021-02-23 MED ORDER — ROSUVASTATIN CALCIUM 10 MG PO TABS: ORAL_TABLET | ORAL | 1 refills | Status: DC

## 2021-02-23 NOTE — Telephone Encounter (Signed)
Pt c/o medication issue:  1. Name of Medication:  rosuvastatin (CRESTOR) 10 MG tablet  2. How are you currently taking this medication (dosage and times per day)?  As written   3. Are you having a reaction (difficulty breathing--STAT)?  No   4. What is your medication issue?  Patient needs a new prescription sent in to a new pharmacy. [facility] [address], [place] - [address]

## 2021-02-23 NOTE — Telephone Encounter (Signed)
Spoke to patient crestor refill sent to pharmacy.Follow up appointment scheduled with Dr.Hochrein 5/13 at 9:15 am.

## 2021-02-23 NOTE — Telephone Encounter (Signed)
Returned call to patient left message to call back. 

## 2021-03-12 NOTE — Progress Notes (Signed)
Cardiology Office Note   Date:  03/13/2021   ID:  [name], DOB [birth year], MRN [id number]  PCP:  Irena Reichmann, DO  Cardiologist:   None   Chief Complaint  Patient presents with  . Leg Pain      History of Present Illness: [name] is a 65 y.o. female who presents for follow up of leg pain.   She had a cardiac cath with minimal coronary plaque in 2007.   She also had a past history of SVT s/p ablation in 2007 as well as WPW.   She was found to have elevated coronary calcium.   She had a negative POET (Plain Old Exercise Treadmill) in 2019.  Since I last saw her she has become a grandmother.  She has had no chest discomfort, neck or arm discomfort.  She has had some shortness of breath with activities such as walking.  Her biggest complaint is pain in her calf muscles when she walks.  It is 4 out of 10 in intensity.  It goes away when she stops walking.  She is not describing palpitations, presyncope or syncope.  She has had no weight gain or edema.  Past Medical History:  Diagnosis Date  . Anxiety   . CAD (coronary artery disease)    minimal plaque by cath 4/07: mLAD 30%, EF 60%  . Depression   . GERD (gastroesophageal reflux disease)   . Hepatic steatosis 03/08/12  . Hiatal hernia   . History of IBS   . History of kidney stones   . Hyperglycemia    Borderline  . Hyperlipidemia   . Hypothyroidism   . Right thyroid nodule     s/p thyroid biopsy; report states follicular epitehlial cells, histiocytes, colloid, and lymphocytes (thyroiditis). Suggest a non-neoplastic process such as a hyperplastic nodule/ non-neoplastic goiter in a background of thyroiditis. Thyroid biopsy was done under a thyroid ultrasound at [facility].  . SVT (supraventricular tachycardia) (HCC)    Hx of, s/p EP study with catheter ablation of AV nodal; re-entry tachycardia 01/10/06, s/p adenosine myoview, 10/06 showing an EF of 70% with no ischemia  . Wolff-Parkinson-White (WPW)  pattern     Past Surgical History:  Procedure Laterality Date  . ABLATION     SVT  . BIOPSY THYROID    . CARDIAC ELECTROPHYSIOLOGY MAPPING AND ABLATION  2007  . CESAREAN SECTION     x2 via lower midline  . EXTRACORPOREAL SHOCK WAVE LITHOTRIPSY Left 12/13/2016   Procedure: LEFT EXTRACORPOREAL SHOCK WAVE LITHOTRIPSY (ESWL);  Surgeon: Jerilee Field, MD;  Location: [facility];  Service: Urology;  Laterality: Left;  . LAPAROSCOPIC CHOLECYSTECTOMY W/ CHOLANGIOGRAPHY    . TOTAL THYROIDECTOMY  11/08/2007  . TUBAL LIGATION       Current Outpatient Medications  Medication Sig Dispense Refill  . aspirin 81 MG EC tablet 1 tablet    . Calcium Carbonate-Vit D-Min 600-200 MG-UNIT TABS Take 1 tablet by mouth daily.    . cetirizine (ZYRTEC) 10 MG tablet Take 10 mg by mouth daily.    . Cholecalciferol (VITAMIN D3) 2000 units TABS Take 2,000 Units by mouth every other day.    . escitalopram (LEXAPRO) 10 MG tablet 1 tablet    . fenofibrate (TRICOR) 145 MG tablet Take 145 mg by mouth every other day.    . levothyroxine (SYNTHROID, LEVOTHROID) 112 MCG tablet Take 112 mcg by mouth daily before breakfast.    . rosuvastatin (CRESTOR) 20 MG tablet TAKE 1 TABLET(10  MG) BY MOUTH DAILY 90 tablet 3   No current facility-administered medications for this visit.    Allergies:   Codeine    ROS:  Please see the history of present illness.   Otherwise, review of systems are positive for none.   All other systems are reviewed and negative.    PHYSICAL EXAM: VS:  BP 118/68   Pulse 67   Ht 5\' 6"  (1.676 m)   Wt 174 lb (78.9 kg)   SpO2 96%   BMI 28.08 kg/m  , BMI Body mass index is 28.08 kg/m. GENERAL:  Well appearing NECK:  No jugular venous distention, waveform within normal limits, carotid upstroke brisk and symmetric, no bruits, no thyromegaly LUNGS:  Clear to auscultation bilaterally CHEST:  Unremarkable HEART:  PMI not displaced or sustained,S1 and S2 within normal limits, no S3, no S4, no clicks, no  rubs, no murmurs ABD:  Flat, positive bowel sounds normal in frequency in pitch, no bruits, no rebound, no guarding, no midline pulsatile mass, no hepatomegaly, no splenomegaly EXT:  2 plus pulses upper, mildly diminished dorsalis pedis and posterior tibialis bilaterally, no edema, no cyanosis no clubbing NEURO: She has a mild resting tremor    EKG:  EKG is  ordered today. Sinus rhythm, rate 67, axis within normal limits, intervals within normal limits, poor anterior R wave correction.  Low voltage in the limb leads and chest leads unchanged from previous.   Recent Labs: No results found for requested labs within last 8760 hours.    Lipid Panel No results found for: CHOL, TRIG, HDL, CHOLHDL, VLDL, LDLCALC, LDLDIRECT    Wt Readings from Last 3 Encounters:  03/13/21 174 lb (78.9 kg)  09/13/19 174 lb 6.4 oz (79.1 kg)  06/14/18 170 lb 3.2 oz (77.2 kg)      Other studies Reviewed: Additional studies/ records that were reviewed today include: Labs Review of the above records demonstrates:  See elsewhere  ASSESSMENT AND PLAN:  SVT:    Ablation was curative.   She has had no new palpitations  ELEVATED CORONARY CALCIUM: She has some shortness of breath, significant family history and significantly elevated coronary calcium score.  I am going to screen her with a POET (Plain Old Exercise Treadmill)  DYSLIPIDEMIA: LDL is 77.  I am going to increase her Crestor to 20 mg.   She wants to meet with a nutritionist.    ELEVATED A1C:  6.2  LEG PAIN: She has leg pain it could be claudication.  I am going to ask for exercise ABIs   Current medicines are reviewed at length with the patient today.  The patient does not have concerns regarding medicines.  The following changes have been made: As above  Labs/ tests ordered today include:    Orders Placed This Encounter  Procedures  . Lipid panel  . Hepatic function panel  . Referral to Nutrition and Diabetes Services  . Cardiac Stress  Test: Informed Consent Details: Physician/Practitioner Attestation; Transcribe to consent form and obtain patient signature  . Exercise Tolerance Test  . EKG 12-Lead  . VAS 06/16/18 LOWER EXTREMITY EXERCISE     Disposition:   FU with me in one year.    Signed, [place], MD  03/13/2021 9:02 AM    [facility]

## 2021-03-13 ENCOUNTER — Encounter: Payer: Self-pay | Admitting: Cardiology

## 2021-03-13 ENCOUNTER — Other Ambulatory Visit: Payer: Self-pay

## 2021-03-13 ENCOUNTER — Ambulatory Visit (INDEPENDENT_AMBULATORY_CARE_PROVIDER_SITE_OTHER): Payer: 59 | Admitting: Cardiology

## 2021-03-13 VITALS — BP 118/68 | HR 67 | Ht 66.0 in | Wt 174.0 lb

## 2021-03-13 DIAGNOSIS — R0602 Shortness of breath: Secondary | ICD-10-CM | POA: Diagnosis not present

## 2021-03-13 DIAGNOSIS — E785 Hyperlipidemia, unspecified: Secondary | ICD-10-CM | POA: Diagnosis not present

## 2021-03-13 DIAGNOSIS — I739 Peripheral vascular disease, unspecified: Secondary | ICD-10-CM | POA: Diagnosis not present

## 2021-03-13 DIAGNOSIS — I471 Supraventricular tachycardia: Secondary | ICD-10-CM

## 2021-03-13 MED ORDER — ROSUVASTATIN CALCIUM 20 MG PO TABS: ORAL_TABLET | ORAL | 3 refills | Status: DC

## 2021-03-13 NOTE — Patient Instructions (Signed)
Medication Instructions:   INCREASE ROSUVASTATIN TO 20 MG ONCE DAILY= 2 OF THE 10 MG TABLETS ONCE DAILY  *If you need a refill on your cardiac medications before your next appointment, please call your pharmacy*   Lab Work:  Your physician recommends that you return for lab work in: 3 MONTHS-FASTING  If you have labs (blood work) drawn today and your tests are completely normal, you will receive your results only by: Marland Kitchen MyChart Message (if you have MyChart) OR . A paper copy in the mail If you have any lab test that is abnormal or we need to change your treatment, we will call you to review the results.   Testing/Procedures:  Your physician has requested that you have an exercise tolerance test. For further information please visit [email or web address] Please also follow instruction sheet, as given.[facility] OFFICE  Your physician has requested that you have an ankle brachial index (ABI). During this test an ultrasound and blood pressure cuff are used to evaluate the arteries that supply the arms and legs with blood. Allow thirty minutes for this exam. There are no restrictions or special instructions.[facility] OFFICE    Follow-Up: At [facility], you and your health needs are our priority.  As part of our continuing mission to provide you with exceptional heart care, we have created designated Provider Care Teams.  These Care Teams include your primary Cardiologist (physician) and Advanced Practice Providers (APPs -  Physician Assistants and Nurse Practitioners) who all work together to provide you with the care you need, when you need it.  We recommend signing up for the patient portal called "MyChart".  Sign up information is provided on this After Visit Summary.  MyChart is used to connect with patients for Virtual Visits (Telemedicine).  Patients are able to view lab/test results, encounter notes, upcoming appointments, etc.  Non-urgent messages can be sent to your provider as well.    To learn more about what you can do with MyChart, go to [email or web address].    Your next appointment:   12 month(s)  The format for your next appointment:   In Person  Provider:   Rollene Rotunda, MD

## 2021-03-24 ENCOUNTER — Ambulatory Visit (HOSPITAL_COMMUNITY): Admission: RE | Admit: 2021-03-24 | Payer: 59 | Source: Ambulatory Visit | Attending: Cardiology | Admitting: Cardiology

## 2021-03-24 ENCOUNTER — Ambulatory Visit (HOSPITAL_COMMUNITY): Payer: 59

## 2021-03-24 ENCOUNTER — Encounter (HOSPITAL_COMMUNITY): Payer: 59

## 2021-04-03 ENCOUNTER — Telehealth (HOSPITAL_COMMUNITY): Payer: Self-pay | Admitting: *Deleted

## 2021-04-03 NOTE — Telephone Encounter (Signed)
Close encounter 

## 2021-04-07 ENCOUNTER — Ambulatory Visit (HOSPITAL_COMMUNITY)
Admission: RE | Admit: 2021-04-07 | Discharge: 2021-04-07 | Disposition: A | Discharge status: Home / Self Care | Payer: 59 | Source: Ambulatory Visit | Attending: Cardiology | Admitting: Cardiology

## 2021-04-07 ENCOUNTER — Ambulatory Visit (HOSPITAL_COMMUNITY)
Admission: RE | Admit: 2021-04-07 | Discharge: 2021-04-07 | Disposition: A | Discharge status: Home / Self Care | Payer: 59 | Source: Ambulatory Visit | Attending: Internal Medicine | Admitting: Internal Medicine

## 2021-04-07 ENCOUNTER — Other Ambulatory Visit: Payer: Self-pay

## 2021-04-07 DIAGNOSIS — R0602 Shortness of breath: Secondary | ICD-10-CM | POA: Diagnosis present

## 2021-04-07 DIAGNOSIS — I739 Peripheral vascular disease, unspecified: Secondary | ICD-10-CM | POA: Diagnosis not present

## 2021-04-10 ENCOUNTER — Telehealth (HOSPITAL_COMMUNITY): Payer: Self-pay | Admitting: *Deleted

## 2021-04-10 NOTE — Telephone Encounter (Signed)
Close encounter 

## 2021-04-14 ENCOUNTER — Ambulatory Visit (HOSPITAL_COMMUNITY)
Admission: RE | Admit: 2021-04-14 | Discharge: 2021-04-14 | Disposition: A | Discharge status: Home / Self Care | Payer: 59 | Source: Ambulatory Visit | Attending: Cardiovascular Disease | Admitting: Cardiovascular Disease

## 2021-04-14 ENCOUNTER — Other Ambulatory Visit: Payer: Self-pay

## 2021-04-14 DIAGNOSIS — R0602 Shortness of breath: Secondary | ICD-10-CM | POA: Insufficient documentation

## 2021-04-14 LAB — EXERCISE TOLERANCE TEST
Estimated workload: 7 METS
Exercise duration (min): 6 min
Exercise duration (sec): 0 s
MPHR: 158 {beats}/min
Peak HR: 150 {beats}/min
Percent HR: 94 %
Rest HR: 81 {beats}/min

## 2021-05-06 ENCOUNTER — Telehealth: Payer: Self-pay | Admitting: Cardiology

## 2021-05-06 NOTE — Telephone Encounter (Signed)
Combination increases concentration of statin.  She's not on the max dose of rosuvastatin, so she would probably not have any problems, but would increase risk of myalgias.  Tell her it's fine to stop the rosuvastatin while taking Paxlovid.  No need to stop fenofibrate.

## 2021-05-06 NOTE — Telephone Encounter (Signed)
     Pt c/o medication issue:  1. Name of Medication: antiviral med for covid  2. How are you currently taking this medication (dosage and times per day)?   3. Are you having a reaction (difficulty breathing--STAT)?   4. What is your medication issue? Pt said she have covid the 2nd time and her pcp prescribed the antiviral med but was told she needs to hold her cholesterol pills, she wanted to ask Dr. Jenene Slicker recommendations if it's ok to do that.

## 2021-05-06 NOTE — Telephone Encounter (Signed)
Routed to pharmD to review 

## 2021-05-06 NOTE — Telephone Encounter (Signed)
Patient aware and verbalized understanding. °

## 2021-05-12 ENCOUNTER — Other Ambulatory Visit: Payer: Self-pay

## 2021-05-12 ENCOUNTER — Ambulatory Visit (INDEPENDENT_AMBULATORY_CARE_PROVIDER_SITE_OTHER): Payer: 59 | Admitting: Cardiovascular Disease

## 2021-05-12 ENCOUNTER — Encounter: Payer: Self-pay | Admitting: Cardiovascular Disease

## 2021-05-12 VITALS — BP 104/72 | HR 80 | Resp 18 | Ht 66.0 in | Wt 172.4 lb

## 2021-05-12 DIAGNOSIS — E782 Mixed hyperlipidemia: Secondary | ICD-10-CM

## 2021-05-12 DIAGNOSIS — M79604 Pain in right leg: Secondary | ICD-10-CM

## 2021-05-12 DIAGNOSIS — M79605 Pain in left leg: Secondary | ICD-10-CM | POA: Diagnosis not present

## 2021-05-12 DIAGNOSIS — R931 Abnormal findings on diagnostic imaging of heart and coronary circulation: Secondary | ICD-10-CM

## 2021-05-12 NOTE — Patient Instructions (Signed)
Medication Instructions:  No Changes In Medications at this time.  *If you need a refill on your cardiac medications before your next appointment, please call your pharmacy*  Follow-Up: At [facility], you and your health needs are our priority.  As part of our continuing mission to provide you with exceptional heart care, we have created designated Provider Care Teams.  These Care Teams include your primary Cardiologist (physician) and Advanced Practice Providers (APPs -  Physician Assistants and Nurse Practitioners) who all work together to provide you with the care you need, when you need it.  Your next appointment:   AS NEEDED   The format for your next appointment:   In Person  Provider:   Lorine Bears, MD

## 2021-05-12 NOTE — Progress Notes (Signed)
Cardiology Office Note   Date:  05/12/2021   ID:  [name], DOB [birth year], MRN [id number]  PCP:  Irena Reichmann, DO  Cardiologist:  Dr. Antoine Poche  No chief complaint on file.     History of Present Illness: [name] is a 65 y.o. female who was referred by Dr. Antoine Poche for evaluation of possible peripheral arterial disease. She had previous cardiac catheterization in 2007 which with minimal coronary artery disease, SVT status post ablation in 2007 and WPW.  She is known to have elevated coronary calcium score with negative treadmill stress test in 2019.  She has no history of diabetes or tobacco use.  She recently reported bilateral calf discomfort with walking.  The pain started in lower thighs and radiated down to the calves.  It was mostly noticeable in the morning but was not consistent with all kind of activities.  She did not have the symptoms when she was pushing a shopping cart.  She underwent an exercise ABI.  ABI was normal at rest.  Right ABI dropped to 0.90 with exercise and return to normal after 5 minutes.  Left ABI had minimal drop to 0.99.  She reports that her leg symptoms improved without intervention and she denies discomfort at the present time.  No chest pain or shortness of breath.  She wonders if some of her symptoms are related to Crestor.   Past Medical History:  Diagnosis Date   Anxiety    CAD (coronary artery disease)    minimal plaque by cath 4/07: mLAD 30%, EF 60%   Depression    GERD (gastroesophageal reflux disease)    Hepatic steatosis 03/08/12   Hiatal hernia    History of IBS    History of kidney stones    Hyperglycemia    Borderline   Hyperlipidemia    Hypothyroidism    Right thyroid nodule     s/p thyroid biopsy; report states follicular epitehlial cells, histiocytes, colloid, and lymphocytes (thyroiditis). Suggest a non-neoplastic process such as a hyperplastic nodule/ non-neoplastic goiter in a background of thyroiditis. Thyroid  biopsy was done under a thyroid ultrasound at [facility].   SVT (supraventricular tachycardia) (HCC)    Hx of, s/p EP study with catheter ablation of AV nodal; re-entry tachycardia 01/10/06, s/p adenosine myoview, 10/06 showing an EF of 70% with no ischemia   Wolff-Parkinson-White (WPW) pattern     Past Surgical History:  Procedure Laterality Date   ABLATION     SVT   BIOPSY THYROID     CARDIAC ELECTROPHYSIOLOGY MAPPING AND ABLATION  2007   CESAREAN SECTION     x2 via lower midline   EXTRACORPOREAL SHOCK WAVE LITHOTRIPSY Left 12/13/2016   Procedure: LEFT EXTRACORPOREAL SHOCK WAVE LITHOTRIPSY (ESWL);  Surgeon: Jerilee Field, MD;  Location: [facility];  Service: Urology;  Laterality: Left;   LAPAROSCOPIC CHOLECYSTECTOMY W/ CHOLANGIOGRAPHY     TOTAL THYROIDECTOMY  11/08/2007   TUBAL LIGATION       Current Outpatient Medications  Medication Sig Dispense Refill   aspirin 81 MG EC tablet 1 tablet     Calcium Carbonate-Vit D-Min 600-200 MG-UNIT TABS Take 1 tablet by mouth daily.     Cholecalciferol (VITAMIN D3) 2000 units TABS Take 2,000 Units by mouth every other day.     escitalopram (LEXAPRO) 10 MG tablet 1 tablet     fenofibrate (TRICOR) 145 MG tablet Take 145 mg by mouth every other day.     levothyroxine (SYNTHROID, LEVOTHROID) 112 MCG  tablet Take 112 mcg by mouth daily before breakfast.     rosuvastatin (CRESTOR) 20 MG tablet TAKE 1 TABLET(10 MG) BY MOUTH DAILY 90 tablet 3   No current facility-administered medications for this visit.    Allergies:   Codeine    Social History:  The patient  reports that she has never smoked. She has never used smokeless tobacco. She reports current alcohol use. She reports that she does not use drugs.   Family History:  The patient's family history includes Bladder Cancer in her father; Coronary artery disease (age of onset: 41) in her mother; Dementia in her father and sister; Heart attack (age of onset: 36) in her father; Stroke  (age of onset: 74) in her mother.    ROS:  Please see the history of present illness.   Otherwise, review of systems are positive for none.   All other systems are reviewed and negative.    PHYSICAL EXAM: VS:  BP 104/72   Pulse 80   Resp 18   Ht 5\' 6"  (1.676 m)   Wt 172 lb 6.4 oz (78.2 kg)   SpO2 94%   BMI 27.83 kg/m  , BMI Body mass index is 27.83 kg/m. GEN: Well nourished, well developed, in no acute distress  HEENT: normal  Neck: no JVD, carotid bruits, or masses Cardiac: RRR; no murmurs, rubs, or gallops,no edema  Respiratory:  clear to auscultation bilaterally, normal work of breathing GI: soft, nontender, nondistended, + BS MS: no deformity or atrophy  Skin: warm and dry, no rash Neuro:  Strength and sensation are intact Psych: euthymic mood, full affect Vascular: Femoral pulses normal bilaterally.  Dorsalis pedis is +2 on the right and +1 on the left.  Posterior tibialis +2 bilaterally.   EKG:  EKG is not ordered today.    Recent Labs: No results found for requested labs within last 8760 hours.    Lipid Panel No results found for: CHOL, TRIG, HDL, CHOLHDL, VLDL, LDLCALC, LDLDIRECT    Wt Readings from Last 3 Encounters:  05/12/21 172 lb 6.4 oz (78.2 kg)  03/13/21 174 lb (78.9 kg)  09/13/19 174 lb 6.4 oz (79.1 kg)      No flowsheet data found.    ASSESSMENT AND PLAN:  1.  Bilateral leg pain: I am not convinced that her symptoms were due to peripheral arterial disease.  Her leg symptoms were not always reproducible with the same kind of exercise.  She did not have the symptoms when she was pushing on a shopping cart which raises the possibility of pseudoclaudication.  By physical exam, her femoral pulses are normal which makes aortoiliac disease very unlikely.  In addition, she has palpable distal pulses. There was minimal drop in ABI with exercise.  Given that her symptoms improved without intervention.  I do not recommend further evaluation.  If she has  recurrent leg symptoms, CTA of the abdominal aorta with lower extremity runoff can be considered.  2.  Elevated coronary calcium score: Currently with no anginal symptoms.  3.  Mixed hyperlipidemia: Currently on rosuvastatin and fenofibrate.    Disposition:   FU with me as needed  Signed,  13/12/20, MD  05/12/2021 1:14 PM    [facility]

## 2021-06-09 LAB — HEPATIC FUNCTION PANEL
ALT: 20 IU/L (ref 0–32)
AST: 27 IU/L (ref 0–40)
Albumin: 4.2 g/dL (ref 3.8–4.8)
Alkaline Phosphatase: 47 IU/L (ref 44–121)
Bilirubin Total: 0.4 mg/dL (ref 0.0–1.2)
Bilirubin, Direct: 0.14 mg/dL (ref 0.00–0.40)
Total Protein: 6.8 g/dL (ref 6.0–8.5)

## 2021-06-09 LAB — LIPID PANEL
Chol/HDL Ratio: 4.4 ratio (ref 0.0–4.4)
Cholesterol, Total: 119 mg/dL (ref 100–199)
HDL: 27 mg/dL — ABNORMAL LOW (ref >39)
LDL Chol Calc (NIH): 68 mg/dL (ref 0–99)
Triglycerides: 136 mg/dL (ref 0–149)
VLDL Cholesterol Cal: 24 mg/dL (ref 5–40)

## 2021-11-06 DIAGNOSIS — Z124 Encounter for screening for malignant neoplasm of cervix: Secondary | ICD-10-CM | POA: Diagnosis not present

## 2021-11-06 DIAGNOSIS — Z806 Family history of leukemia: Secondary | ICD-10-CM | POA: Diagnosis not present

## 2021-11-06 DIAGNOSIS — Z8041 Family history of malignant neoplasm of ovary: Secondary | ICD-10-CM | POA: Diagnosis not present

## 2021-11-06 DIAGNOSIS — Z8052 Family history of malignant neoplasm of bladder: Secondary | ICD-10-CM | POA: Diagnosis not present

## 2022-01-07 DIAGNOSIS — K1121 Acute sialoadenitis: Secondary | ICD-10-CM | POA: Diagnosis not present

## 2022-01-20 DIAGNOSIS — E559 Vitamin D deficiency, unspecified: Secondary | ICD-10-CM | POA: Diagnosis not present

## 2022-01-20 DIAGNOSIS — E785 Hyperlipidemia, unspecified: Secondary | ICD-10-CM | POA: Diagnosis not present

## 2022-01-20 DIAGNOSIS — E039 Hypothyroidism, unspecified: Secondary | ICD-10-CM | POA: Diagnosis not present

## 2022-01-20 DIAGNOSIS — R7303 Prediabetes: Secondary | ICD-10-CM | POA: Diagnosis not present

## 2022-01-27 DIAGNOSIS — I471 Supraventricular tachycardia: Secondary | ICD-10-CM | POA: Diagnosis not present

## 2022-01-27 DIAGNOSIS — N1831 Chronic kidney disease, stage 3a: Secondary | ICD-10-CM | POA: Diagnosis not present

## 2022-01-27 DIAGNOSIS — G250 Essential tremor: Secondary | ICD-10-CM | POA: Diagnosis not present

## 2022-01-27 DIAGNOSIS — E559 Vitamin D deficiency, unspecified: Secondary | ICD-10-CM | POA: Diagnosis not present

## 2022-01-27 DIAGNOSIS — R7303 Prediabetes: Secondary | ICD-10-CM | POA: Diagnosis not present

## 2022-01-27 DIAGNOSIS — E039 Hypothyroidism, unspecified: Secondary | ICD-10-CM | POA: Diagnosis not present

## 2022-01-27 DIAGNOSIS — K589 Irritable bowel syndrome without diarrhea: Secondary | ICD-10-CM | POA: Diagnosis not present

## 2022-01-27 DIAGNOSIS — E785 Hyperlipidemia, unspecified: Secondary | ICD-10-CM | POA: Diagnosis not present

## 2022-02-17 DIAGNOSIS — R059 Cough, unspecified: Secondary | ICD-10-CM | POA: Diagnosis not present

## 2022-03-08 ENCOUNTER — Ambulatory Visit: Payer: 59 | Admitting: Pulmonary Disease

## 2022-03-08 ENCOUNTER — Encounter: Payer: Self-pay | Admitting: Pulmonary Disease

## 2022-03-08 VITALS — BP 120/70 | HR 68 | Ht 67.0 in | Wt 166.6 lb

## 2022-03-08 DIAGNOSIS — R053 Chronic cough: Secondary | ICD-10-CM

## 2022-03-08 MED ORDER — FLUTICASONE FUROATE-VILANTEROL 100-25 MCG/ACT IN AEPB: 1.0000 | INHALATION_SPRAY | Freq: Every day | RESPIRATORY_TRACT | 3 refills | Status: DC

## 2022-03-08 MED ORDER — LEVOCETIRIZINE DIHYDROCHLORIDE 5 MG PO TABS: 5.0000 mg | ORAL_TABLET | Freq: Every evening | ORAL | 3 refills | Status: DC

## 2022-03-08 MED ORDER — PREDNISONE 20 MG PO TABS: 20.0000 mg | ORAL_TABLET | Freq: Every day | ORAL | 0 refills | Status: DC

## 2022-03-08 NOTE — Progress Notes (Signed)
? ?      ?[name]    LE:[id number]    [birth year] ? ?Primary Care Physician:Collins, Hinton Dyer, DO ? ?Referring Physician: Janie Morning, DO ?[address] ?[address] ?[place],  [place] [address] ? ?Chief complaint:   ?Patient being evaluated for cough, shortness of breath ? ?HPI: ? ?Has had cough and shortness of breath since about December ?Has used at least 2 courses of antibiotics and steroids and cough medicine with some relief but cough will recur soon after she completes course ? ?Did have COVID twice last year Jan 2022 and July 2022 ? ?Did have allergies growing up and she was on allergy shots when she was younger ? ?Never had a diagnosis of asthma ? ?She smokes socially in high school ? ?Was not exposed to significant amount of secondhand smoke ? ?Always done office work ? ?She does have a pet dog-63 years old ? ?She has allergies which include dust, trees, cats, dogs ? ?Currently cough is better, no fever, no chills, no chest pains or chest discomfort, has not been wheezing ?Has not used any inhalers in the past ? ?Outpatient Encounter Medications as of 03/08/2022  ?Medication Sig  ? calcium citrate (CALCITRATE - DOSED IN MG ELEMENTAL CALCIUM) 950 (200 Ca) MG tablet Take 200 mg of elemental calcium by mouth daily.  ? Cholecalciferol (VITAMIN D3) 2000 units TABS Take 2,000 Units by mouth every other day.  ? escitalopram (LEXAPRO) 10 MG tablet 1 tablet  ? fenofibrate (TRICOR) 145 MG tablet Take 145 mg by mouth every other day.  ? fluticasone furoate-vilanterol (BREO ELLIPTA) 100-25 MCG/ACT AEPB Inhale 1 puff into the lungs daily.  ? levocetirizine (XYZAL) 5 MG tablet Take 1 tablet (5 mg total) by mouth every evening.  ? levothyroxine (SYNTHROID, LEVOTHROID) 112 MCG tablet Take 112 mcg by mouth daily before breakfast.  ? Multiple Vitamin (MULTIVITAMIN) capsule Take 1 capsule by mouth daily.  ? predniSONE (DELTASONE) 20 MG tablet Take 1 tablet (20 mg total) by mouth daily with breakfast.  ? rosuvastatin  (CRESTOR) 20 MG tablet TAKE 1 TABLET(10 MG) BY MOUTH DAILY (Patient taking differently: 20 mg. TAKE 1 TABLET(10 MG) BY MOUTH DAILY)  ? [DISCONTINUED] Calcium Carbonate-Vit D-Min 600-200 MG-UNIT TABS Take 1 tablet by mouth daily.  ? [DISCONTINUED] aspirin 81 MG EC tablet 1 tablet  ? ?No facility-administered encounter medications on file as of 03/08/2022.  ? ? ?Allergies as of 03/08/2022 - Review Complete 03/08/2022  ?Allergen Reaction Noted  ? Codeine Nausea And Vomiting and Swelling 12/25/2008  ? ? ?Past Medical History:  ?Diagnosis Date  ? Anxiety   ? CAD (coronary artery disease)   ? minimal plaque by cath 4/07: mLAD 30%, EF 60%  ? Depression   ? GERD (gastroesophageal reflux disease)   ? Hepatic steatosis 03/08/12  ? Hiatal hernia   ? History of IBS   ? History of kidney stones   ? Hyperglycemia   ? Borderline  ? Hyperlipidemia   ? Hypothyroidism   ? Right thyroid nodule   ?  s/p thyroid biopsy; report states follicular epitehlial cells, histiocytes, colloid, and lymphocytes (thyroiditis). Suggest a non-neoplastic process such as a hyperplastic nodule/ non-neoplastic goiter in a background of thyroiditis. Thyroid biopsy was done under a thyroid ultrasound at [facility].  ? SVT (supraventricular tachycardia) ([place])   ? Hx of, s/p EP study with catheter ablation of AV nodal; re-entry tachycardia 01/10/06, s/p adenosine myoview, 10/06 showing an EF of 70% with no ischemia  ?  Wolff-Parkinson-White (WPW) pattern   ? ? ?Past Surgical History:  ?Procedure Laterality Date  ? ABLATION    ? SVT  ? BIOPSY THYROID    ? CARDIAC ELECTROPHYSIOLOGY [place] AND ABLATION  2007  ? CESAREAN SECTION    ? x2 via lower midline  ? EXTRACORPOREAL SHOCK WAVE LITHOTRIPSY Left 12/13/2016  ? Procedure: LEFT EXTRACORPOREAL SHOCK WAVE LITHOTRIPSY (ESWL);  Surgeon: Festus Aloe, MD;  Location: [facility];  Service: Urology;  Laterality: Left;  ? LAPAROSCOPIC CHOLECYSTECTOMY W/ CHOLANGIOGRAPHY    ? TOTAL THYROIDECTOMY  11/08/2007  ? TUBAL  LIGATION    ? ? ?Family History  ?Problem Relation Age of Onset  ? Coronary artery disease Mother 4  ?     CABG  ? Stroke Mother 29  ? Heart attack Father 74  ?     MI x2 and had CABG  ? Bladder Cancer Father   ? Dementia Father   ? Dementia Sister   ?     early onset frontal lobe  ? Colon cancer Neg Hx   ? ? ?Social History  ? ?Socioeconomic History  ? Marital status: Married  ?  Spouse name: Not on file  ? Number of children: Not on file  ? Years of education: Not on file  ? Highest education level: Not on file  ?Occupational History  ? Occupation: Not employed outside the home  ?Tobacco Use  ? Smoking status: Never  ? Smokeless tobacco: Never  ?Substance and Sexual Activity  ? Alcohol use: Yes  ?  Comment: rare  ? Drug use: No  ? Sexual activity: Not on file  ?Other Topics Concern  ? Not on file  ?Social History Narrative  ? Lives in [place] with husband  ? 2 adult children  ? Education College degree.    ? caffiene  2 glasses of tea  ? ?Social Determinants of Health  ? ?Financial Resource Strain: Not on file  ?Food Insecurity: Not on file  ?Transportation Needs: Not on file  ?Physical Activity: Not on file  ?Stress: Not on file  ?Social Connections: Not on file  ?Intimate Partner Violence: Not on file  ? ? ?Review of Systems  ?Constitutional:  Negative for fatigue.  ?Respiratory:  Positive for cough and shortness of breath.   ? ?Vitals:  ? 03/08/22 1423  ?BP: 120/70  ?Pulse: 68  ?SpO2: 96%  ? ? ? ?Physical Exam ?Constitutional:   ?   Appearance: Normal appearance.  ?HENT:  ?   Head: Normocephalic.  ?   Right Ear: Tympanic membrane normal.  ?   Mouth/Throat:  ?   Mouth: Mucous membranes are moist.  ?Cardiovascular:  ?   Rate and Rhythm: Normal rate and regular rhythm.  ?   Heart sounds: No murmur heard. ?  No friction rub.  ?Pulmonary:  ?   Effort: No respiratory distress.  ?   Breath sounds: No stridor. No wheezing or rhonchi.  ?Musculoskeletal:  ?   Cervical back: No rigidity or tenderness.  ?Neurological:   ?   General: No focal deficit present.  ?   Mental Status: She is alert.  ?Psychiatric:     ?   Mood and Affect: Mood normal.  ? ?Data Reviewed: ?Recent chest x-ray was negative for significant infiltrate ? ?Had a cardiac CT done in 2019-reviewed by myself showing no interstitial process at the time ? ?Assessment:  ?Possible airway hyperactivity ? ?Allergies may be making cough and shortness of breath worse ? ?Possible development  of asthma ? ?Patient had a concern about chronic obstructive pulmonary disease but never smoked, no occupational predisposition to any agents that could cause obstructive lung disease as well ? ? ? ?Plan/Recommendations: ?Empirically treat with a course of 7 days of steroids ? ?Xyzal for allergies ? ?Breo to be used once a day, Breo 100 called in ? ?Avoid known triggers as possible ? ?Obtain pulmonary function test at next visit ? ?Tentative follow-up in about 4 to 6 weeks ? ?Encouraged to call with any significant concerns ? ? ?Sherrilyn Rist MD ?[facility] ?03/08/2022, 2:59 PM ? ?CC: Janie Morning, DO ? ? ?

## 2022-03-08 NOTE — Patient Instructions (Addendum)
We will get a breathing study on you at the next visit ? ?Prescription for prednisone 20 mg daily will be sent to pharmacy for you ? ?Prescription for Xyzal for allergies ? ?I will start you on an inhaler called Breo to be used once a day-make sure you rinse your mouth following use ? ?Current avoid known triggers as best as possible ? ?Depending on the breathing study we may need to get a CT scan of the chest-I do not believe we need to order one now ? ?Call with significant concerns ? ?Tentative follow-up in 4 to 6 weeks ?

## 2022-03-27 ENCOUNTER — Other Ambulatory Visit: Payer: Self-pay | Admitting: Cardiology

## 2022-03-27 DIAGNOSIS — E785 Hyperlipidemia, unspecified: Secondary | ICD-10-CM

## 2022-04-22 ENCOUNTER — Encounter: Payer: Self-pay | Admitting: Pulmonary Disease

## 2022-04-22 ENCOUNTER — Ambulatory Visit: Payer: 59 | Admitting: Pulmonary Disease

## 2022-04-22 ENCOUNTER — Ambulatory Visit (INDEPENDENT_AMBULATORY_CARE_PROVIDER_SITE_OTHER): Payer: 59 | Admitting: Pulmonary Disease

## 2022-04-22 VITALS — BP 105/65 | HR 73 | Temp 99.2°F | Ht 67.0 in | Wt 172.0 lb

## 2022-04-22 DIAGNOSIS — R053 Chronic cough: Secondary | ICD-10-CM

## 2022-04-22 LAB — PULMONARY FUNCTION TEST
DL/VA % pred: 82 %
DL/VA: 3.38 ml/min/mmHg/L
DLCO cor % pred: 60 %
DLCO cor: 13.47 ml/min/mmHg
DLCO unc % pred: 60 %
DLCO unc: 13.47 ml/min/mmHg
FEF 25-75 Post: 3.74 L/sec
FEF 25-75 Pre: 3.13 L/sec
FEF2575-%Change-Post: 19 %
FEF2575-%Pred-Post: 155 %
FEF2575-%Pred-Pre: 130 %
FEV1-%Change-Post: 5 %
FEV1-%Pred-Post: 79 %
FEV1-%Pred-Pre: 75 %
FEV1-Post: 2.2 L
FEV1-Pre: 2.08 L
FEV1FVC-%Change-Post: 5 %
FEV1FVC-%Pred-Pre: 111 %
FEV6-%Change-Post: 0 %
FEV6-%Pred-Post: 69 %
FEV6-%Pred-Pre: 69 %
FEV6-Post: 2.41 L
FEV6-Pre: 2.42 L
FEV6FVC-%Pred-Post: 103 %
FEV6FVC-%Pred-Pre: 103 %
FVC-%Change-Post: 0 %
FVC-%Pred-Post: 67 %
FVC-%Pred-Pre: 67 %
FVC-Post: 2.43 L
FVC-Pre: 2.42 L
Post FEV1/FVC ratio: 91 %
Post FEV6/FVC ratio: 100 %
Pre FEV1/FVC ratio: 86 %
Pre FEV6/FVC Ratio: 100 %
RV % pred: 71 %
RV: 1.57 L
TLC % pred: 78 %
TLC: 4.33 L

## 2022-04-22 NOTE — Patient Instructions (Signed)
You may discontinue Breo  Reinitiate if worsening symptoms or resurgence of a cough  Xyzal may be stopped at some point as well and you may elect to use it whenever you have more symptoms  I will see you in about 6 months  Your breathing study did show mild restriction-this can be due to multiple factors -We will keep an eye on this and repeat in about a year or so  Call with significant concerns

## 2022-04-22 NOTE — Progress Notes (Signed)
Full PFT performed today. °

## 2022-04-22 NOTE — Progress Notes (Signed)
[name]    [id number]    1956-05-21  Primary Care Physician:Collins, Annabelle Harman, DO  Referring Physician: Irena Reichmann, DO [address] [place],  [place] [address]  Chief complaint:   Patient being evaluated for cough, shortness of breath  HPI:  Cough and shortness of breath are better  Has been using Breo Also using Xyzal for allergies  Has been doing relatively well  She still does have some nasal stuffiness and clearing of the back of her throat  Otherwise she feels well  Did receive allergy shots growing up, past history of smoking socially in high school  Was not exposed to significant amount of secondhand smoke  Always done office work  She does have a pet dog-48 years old  She has allergies which include dust, trees, cats, dogs  Currently cough is better, no fever, no chills, no chest pains or chest discomfort, has not been wheezing Has not used any inhalers in the past  Outpatient Encounter Medications as of 04/22/2022  Medication Sig   calcium citrate (CALCITRATE - DOSED IN MG ELEMENTAL CALCIUM) 950 (200 Ca) MG tablet Take 200 mg of elemental calcium by mouth daily.   Cholecalciferol (VITAMIN D3) 2000 units TABS Take 2,000 Units by mouth every other day.   escitalopram (LEXAPRO) 10 MG tablet 1 tablet   fenofibrate (TRICOR) 145 MG tablet Take 145 mg by mouth every other day.   fluticasone furoate-vilanterol (BREO ELLIPTA) 100-25 MCG/ACT AEPB Inhale 1 puff into the lungs daily.   levocetirizine (XYZAL) 5 MG tablet Take 1 tablet (5 mg total) by mouth every evening.   levothyroxine (SYNTHROID, LEVOTHROID) 112 MCG tablet Take 112 mcg by mouth daily before breakfast.   Multiple Vitamin (MULTIVITAMIN) capsule Take 1 capsule by mouth daily.   rosuvastatin (CRESTOR) 20 MG tablet TAKE ONE TABLET BY MOUTH DAILY   [DISCONTINUED] predniSONE (DELTASONE) 20 MG tablet Take 1 tablet (20 mg total) by mouth daily with breakfast.   No  facility-administered encounter medications on file as of 04/22/2022.    Allergies as of 04/22/2022 - Review Complete 04/22/2022  Allergen Reaction Noted   Codeine Nausea And Vomiting and Swelling 12/25/2008    Past Medical History:  Diagnosis Date   Anxiety    CAD (coronary artery disease)    minimal plaque by cath 4/07: mLAD 30%, EF 60%   Depression    GERD (gastroesophageal reflux disease)    Hepatic steatosis 03/08/12   Hiatal hernia    History of IBS    History of kidney stones    Hyperglycemia    Borderline   Hyperlipidemia    Hypothyroidism    Right thyroid nodule     s/p thyroid biopsy; report states follicular epitehlial cells, histiocytes, colloid, and lymphocytes (thyroiditis). Suggest a non-neoplastic process such as a hyperplastic nodule/ non-neoplastic goiter in a background of thyroiditis. Thyroid biopsy was done under a thyroid ultrasound at [facility].   SVT (supraventricular tachycardia) (HCC)    Hx of, s/p EP study with catheter ablation of AV nodal; re-entry tachycardia 01/10/06, s/p adenosine myoview, 10/06 showing an EF of 70% with no ischemia   Wolff-Parkinson-White (WPW) pattern     Past Surgical History:  Procedure Laterality Date   ABLATION     SVT   BIOPSY THYROID     CARDIAC ELECTROPHYSIOLOGY MAPPING AND ABLATION  2007   CESAREAN SECTION     x2 via lower midline   EXTRACORPOREAL SHOCK WAVE LITHOTRIPSY  Left 12/13/2016   Procedure: LEFT EXTRACORPOREAL SHOCK WAVE LITHOTRIPSY (ESWL);  Surgeon: Jerilee Field, MD;  Location: [facility];  Service: Urology;  Laterality: Left;   LAPAROSCOPIC CHOLECYSTECTOMY W/ CHOLANGIOGRAPHY     TOTAL THYROIDECTOMY  11/08/2007   TUBAL LIGATION      Family History  Problem Relation Age of Onset   Coronary artery disease Mother 13       CABG   Stroke Mother 13   Heart attack Father 80       MI x2 and had CABG   Bladder Cancer Father    Dementia Father    Dementia Sister        early onset frontal lobe    Colon cancer Neg Hx     Social History   Socioeconomic History   Marital status: Married    Spouse name: Not on file   Number of children: Not on file   Years of education: Not on file   Highest education level: Not on file  Occupational History   Occupation: Not employed outside the home  Tobacco Use   Smoking status: Never   Smokeless tobacco: Never  Substance and Sexual Activity   Alcohol use: Yes    Comment: rare   Drug use: No   Sexual activity: Not on file  Other Topics Concern   Not on file  Social History Narrative   Lives in [place] with husband   2 adult children   Education College degree.     caffiene  2 glasses of tea   Social Determinants of Health   Financial Resource Strain: Not on file  Food Insecurity: Not on file  Transportation Needs: Not on file  Physical Activity: Not on file  Stress: Not on file  Social Connections: Not on file  Intimate Partner Violence: Not on file    Review of Systems  Constitutional:  Negative for fatigue.  Respiratory:  Positive for cough and shortness of breath.     Vitals:   04/22/22 1344  BP: 105/65  Pulse: 73  Temp: 99.2 F (37.3 C)  SpO2: 96%     Physical Exam Constitutional:      Appearance: Normal appearance.  HENT:     Head: Normocephalic.     Right Ear: Tympanic membrane normal.     Mouth/Throat:     Mouth: Mucous membranes are moist.  Cardiovascular:     Rate and Rhythm: Normal rate and regular rhythm.     Heart sounds: No murmur heard.    No friction rub.  Pulmonary:     Effort: No respiratory distress.     Breath sounds: No stridor. No wheezing or rhonchi.  Musculoskeletal:     Cervical back: No rigidity or tenderness.  Neurological:     General: No focal deficit present.     Mental Status: She is alert.  Psychiatric:        Mood and Affect: Mood normal.    Data Reviewed: Recent chest x-ray was negative for significant infiltrate  Had a cardiac CT done in 2019-reviewed by  myself showing no interstitial process at the time  PFT with mild restrictive disease with mild reduction in diffusing capacity  Assessment:  Cough, airway hyperactivity  Allergies   Symptoms are significantly improved    Plan/Recommendations: May stop Breo for a while to see the effect  May also soft Xyzal to only be used when needed  Tentative follow-up in 6 months  Encouraged to call with any significant concerns  Virl Diamond MD [facility] 04/22/2022, 1:52 PM  CC: Irena Reichmann, DO

## 2022-04-22 NOTE — Patient Instructions (Signed)
Full PFT performed today. °

## 2022-05-05 ENCOUNTER — Ambulatory Visit: Payer: 59 | Admitting: Cardiology

## 2022-05-06 DIAGNOSIS — I739 Peripheral vascular disease, unspecified: Secondary | ICD-10-CM | POA: Insufficient documentation

## 2022-05-06 NOTE — Progress Notes (Signed)
Cardiology Office Note   Date:  05/07/2022   ID:  [name], DOB 26-Feb-1956, MRN [id number]  PCP:  Irena Reichmann, DO  Cardiologist:   None   Chief Complaint  Patient presents with   Leg Pain      History of Present Illness: [name] is a 66 y.o. female who presents for follow up of leg pain.   She had a cardiac cath with minimal coronary plaque in 2007.   She also had a past history of SVT s/p ablation in 2007 as well as WPW.   She was found to have elevated coronary calcium.   She had a negative POET (Plain Old Exercise Treadmill) in 2019 and again in 2022.  She had leg pain with exercise ABIs being mildly abnormal.  She was managed conservatively by Dr. Kirke Corin.    She has been having no new cardiovascular complaints.  She has not been walking as much as I would like.  She gets on the treadmill sometimes. The patient denies any new symptoms such as chest discomfort, neck or arm discomfort. There has been no new shortness of breath, PND or orthopnea. There have been no reported palpitations, presyncope or syncope.  She still occasionally gets leg pain.   Past Medical History:  Diagnosis Date   Anxiety    CAD (coronary artery disease)    minimal plaque by cath 4/07: mLAD 30%, EF 60%   Depression    GERD (gastroesophageal reflux disease)    Hepatic steatosis 03/08/12   Hiatal hernia    History of IBS    History of kidney stones    Hyperglycemia    Borderline   Hyperlipidemia    Hypothyroidism    Right thyroid nodule     s/p thyroid biopsy; report states follicular epitehlial cells, histiocytes, colloid, and lymphocytes (thyroiditis). Suggest a non-neoplastic process such as a hyperplastic nodule/ non-neoplastic goiter in a background of thyroiditis. Thyroid biopsy was done under a thyroid ultrasound at [facility].   SVT (supraventricular tachycardia) (HCC)    Hx of, s/p EP study with catheter ablation of AV nodal; re-entry tachycardia 01/10/06, s/p adenosine  myoview, 10/06 showing an EF of 70% with no ischemia   Wolff-Parkinson-White (WPW) pattern     Past Surgical History:  Procedure Laterality Date   ABLATION     SVT   BIOPSY THYROID     CARDIAC ELECTROPHYSIOLOGY MAPPING AND ABLATION  2007   CESAREAN SECTION     x2 via lower midline   EXTRACORPOREAL SHOCK WAVE LITHOTRIPSY Left 12/13/2016   Procedure: LEFT EXTRACORPOREAL SHOCK WAVE LITHOTRIPSY (ESWL);  Surgeon: Jerilee Field, MD;  Location: [facility];  Service: Urology;  Laterality: Left;   LAPAROSCOPIC CHOLECYSTECTOMY W/ CHOLANGIOGRAPHY     TOTAL THYROIDECTOMY  11/08/2007   TUBAL LIGATION       Current Outpatient Medications  Medication Sig Dispense Refill   ALPRAZolam (XANAX) 0.5 MG tablet      calcium citrate (CALCITRATE - DOSED IN MG ELEMENTAL CALCIUM) 950 (200 Ca) MG tablet Take 200 mg of elemental calcium by mouth as needed.     Cholecalciferol (VITAMIN D3) 2000 units TABS Take 2,000 Units by mouth every other day.     escitalopram (LEXAPRO) 10 MG tablet 1 tablet     fenofibrate (TRICOR) 145 MG tablet Take 145 mg by mouth every other day.     fluticasone furoate-vilanterol (BREO ELLIPTA) 100-25 MCG/ACT AEPB Inhale 1 puff into the lungs daily. 1 each 3  levocetirizine (XYZAL) 5 MG tablet Take 1 tablet (5 mg total) by mouth every evening. 30 tablet 3   levothyroxine (SYNTHROID, LEVOTHROID) 112 MCG tablet Take 112 mcg by mouth daily before breakfast.     Multiple Vitamin (MULTIVITAMIN) capsule Take 1 capsule by mouth daily.     rosuvastatin (CRESTOR) 20 MG tablet TAKE ONE TABLET BY MOUTH DAILY 90 tablet 1   No current facility-administered medications for this visit.    Allergies:   Codeine    ROS:  Please see the history of present illness.   Otherwise, review of systems are positive for she has diarrhea alternating with constipation is related to irritable bowel syndrome.   All other systems are reviewed and negative.    PHYSICAL EXAM: VS:  BP 110/78   Pulse 76   Ht 5'  6" (1.676 m)   Wt 170 lb 6.4 oz (77.3 kg)   SpO2 97%   BMI 27.50 kg/m  , BMI Body mass index is 27.5 kg/m. GENERAL:  Well appearing NECK:  No jugular venous distention, waveform within normal limits, carotid upstroke brisk and symmetric, no bruits, no thyromegaly LUNGS:  Clear to auscultation bilaterally CHEST:  Unremarkable HEART:  PMI not displaced or sustained,S1 and S2 within normal limits, no S3, no S4, no clicks, no rubs, no murmurs ABD:  Flat, positive bowel sounds normal in frequency in pitch, no bruits, no rebound, no guarding, no midline pulsatile mass, no hepatomegaly, no splenomegaly EXT:  2 plus pulses throughout, no edema, no cyanosis no clubbing   EKG:  EKG is  ordered today. Sinus rhythm, rate 76, axis within normal limits, intervals within normal limits, poor anterior R wave correction.  Low voltage in the limb leads and chest leads unchanged from previous.   Recent Labs: 06/09/2021: ALT 20    Lipid Panel    Component Value Date/Time   CHOL 119 06/09/2021 1102   TRIG 136 06/09/2021 1102   HDL 27 (L) 06/09/2021 1102   CHOLHDL 4.4 06/09/2021 1102   LDLCALC 68 06/09/2021 1102      Wt Readings from Last 3 Encounters:  05/07/22 170 lb 6.4 oz (77.3 kg)  04/22/22 172 lb (78 kg)  03/08/22 166 lb 9.6 oz (75.6 kg)      Other studies Reviewed: Additional studies/ records that were reviewed today include: Labs Review of the above records demonstrates:  NA  ASSESSMENT AND PLAN:  SVT:    She has had no further symptomatic arrhythmias.  Ablation apparently was curative.   ELEVATED CORONARY CALCIUM:  Since her POET (Plain Old Exercise Treadmill) in 2022.  No further testing.  She will continue with risk reduction.   DYSLIPIDEMIA: LDL was 68 with an HDL of 27.  She would like to meet with a nutritionist.  She wants to talk about diet for this as well as foods that she can tolerate with her irritable bowel.   ELEVATED A1C:  was 6.2.  No change in therapy.   LEG  PAIN:   We will again manage this medically.    Current medicines are reviewed at length with the patient today.  The patient does not have concerns regarding medicines.  The following changes have been made: As above  Labs/ tests ordered today include:    Orders Placed This Encounter  Procedures   Ambulatory referral to Nutrition and Diabetic Education   EKG 12-Lead     Disposition:   FU with me in one year.    Signed, Rollene Rotunda,  MD  05/07/2022 1:59 PM     [facility]

## 2022-05-07 ENCOUNTER — Encounter: Payer: Self-pay | Admitting: Cardiology

## 2022-05-07 ENCOUNTER — Ambulatory Visit: Payer: 59 | Admitting: Cardiology

## 2022-05-07 VITALS — BP 110/78 | HR 76 | Ht 66.0 in | Wt 170.4 lb

## 2022-05-07 DIAGNOSIS — I471 Supraventricular tachycardia: Secondary | ICD-10-CM

## 2022-05-07 DIAGNOSIS — R931 Abnormal findings on diagnostic imaging of heart and coronary circulation: Secondary | ICD-10-CM | POA: Diagnosis not present

## 2022-05-07 DIAGNOSIS — K589 Irritable bowel syndrome without diarrhea: Secondary | ICD-10-CM

## 2022-05-07 DIAGNOSIS — I739 Peripheral vascular disease, unspecified: Secondary | ICD-10-CM

## 2022-05-07 DIAGNOSIS — E785 Hyperlipidemia, unspecified: Secondary | ICD-10-CM | POA: Diagnosis not present

## 2022-05-07 NOTE — Patient Instructions (Signed)
  Follow-Up: At [facility], you and your health needs are our priority.  As part of our continuing mission to provide you with exceptional heart care, we have created designated Provider Care Teams.  These Care Teams include your primary Cardiologist (physician) and Advanced Practice Providers (APPs -  Physician Assistants and Nurse Practitioners) who all work together to provide you with the care you need, when you need it.  We recommend signing up for the patient portal called "MyChart".  Sign up information is provided on this After Visit Summary.  MyChart is used to connect with patients for Virtual Visits (Telemedicine).  Patients are able to view lab/test results, encounter notes, upcoming appointments, etc.  Non-urgent messages can be sent to your provider as well.   To learn more about what you can do with MyChart, go to [email or web address].    Your next appointment:   12 month(s)  The format for your next appointment:   In Person  Provider:   James Hochrein MD      Important Information About Sugar       

## 2022-07-28 DIAGNOSIS — E039 Hypothyroidism, unspecified: Secondary | ICD-10-CM | POA: Diagnosis not present

## 2022-07-28 DIAGNOSIS — E785 Hyperlipidemia, unspecified: Secondary | ICD-10-CM | POA: Diagnosis not present

## 2022-07-28 DIAGNOSIS — R7303 Prediabetes: Secondary | ICD-10-CM | POA: Diagnosis not present

## 2022-07-28 DIAGNOSIS — N1831 Chronic kidney disease, stage 3a: Secondary | ICD-10-CM | POA: Diagnosis not present

## 2022-07-28 DIAGNOSIS — I10 Essential (primary) hypertension: Secondary | ICD-10-CM | POA: Diagnosis not present

## 2022-07-28 DIAGNOSIS — E559 Vitamin D deficiency, unspecified: Secondary | ICD-10-CM | POA: Diagnosis not present

## 2022-07-29 LAB — LAB REPORT - SCANNED
A1c: 5.9
EGFR: 57

## 2022-08-04 DIAGNOSIS — Z0000 Encounter for general adult medical examination without abnormal findings: Secondary | ICD-10-CM | POA: Diagnosis not present

## 2022-08-04 DIAGNOSIS — R7303 Prediabetes: Secondary | ICD-10-CM | POA: Diagnosis not present

## 2022-08-04 DIAGNOSIS — E559 Vitamin D deficiency, unspecified: Secondary | ICD-10-CM | POA: Diagnosis not present

## 2022-08-04 DIAGNOSIS — R69 Illness, unspecified: Secondary | ICD-10-CM | POA: Diagnosis not present

## 2022-08-04 DIAGNOSIS — E039 Hypothyroidism, unspecified: Secondary | ICD-10-CM | POA: Diagnosis not present

## 2022-08-04 DIAGNOSIS — G250 Essential tremor: Secondary | ICD-10-CM | POA: Diagnosis not present

## 2022-08-04 DIAGNOSIS — N1831 Chronic kidney disease, stage 3a: Secondary | ICD-10-CM | POA: Diagnosis not present

## 2022-08-04 DIAGNOSIS — E785 Hyperlipidemia, unspecified: Secondary | ICD-10-CM | POA: Diagnosis not present

## 2022-08-04 DIAGNOSIS — K589 Irritable bowel syndrome without diarrhea: Secondary | ICD-10-CM | POA: Diagnosis not present

## 2022-09-14 DIAGNOSIS — Z1231 Encounter for screening mammogram for malignant neoplasm of breast: Secondary | ICD-10-CM | POA: Diagnosis not present

## 2022-09-14 DIAGNOSIS — M85851 Other specified disorders of bone density and structure, right thigh: Secondary | ICD-10-CM | POA: Diagnosis not present

## 2022-09-14 DIAGNOSIS — Z780 Asymptomatic menopausal state: Secondary | ICD-10-CM | POA: Diagnosis not present

## 2022-09-19 ENCOUNTER — Other Ambulatory Visit: Payer: Self-pay | Admitting: Cardiology

## 2022-09-19 DIAGNOSIS — E785 Hyperlipidemia, unspecified: Secondary | ICD-10-CM

## 2022-09-30 DIAGNOSIS — R52 Pain, unspecified: Secondary | ICD-10-CM | POA: Diagnosis not present

## 2022-09-30 DIAGNOSIS — R202 Paresthesia of skin: Secondary | ICD-10-CM | POA: Diagnosis not present

## 2022-11-10 DIAGNOSIS — Z01419 Encounter for gynecological examination (general) (routine) without abnormal findings: Secondary | ICD-10-CM | POA: Diagnosis not present

## 2023-02-02 DIAGNOSIS — R7303 Prediabetes: Secondary | ICD-10-CM | POA: Diagnosis not present

## 2023-02-02 DIAGNOSIS — N1831 Chronic kidney disease, stage 3a: Secondary | ICD-10-CM | POA: Diagnosis not present

## 2023-02-02 DIAGNOSIS — E785 Hyperlipidemia, unspecified: Secondary | ICD-10-CM | POA: Diagnosis not present

## 2023-02-02 DIAGNOSIS — E039 Hypothyroidism, unspecified: Secondary | ICD-10-CM | POA: Diagnosis not present

## 2023-02-03 ENCOUNTER — Encounter: Payer: Self-pay | Admitting: Physician Assistant

## 2023-02-09 DIAGNOSIS — E039 Hypothyroidism, unspecified: Secondary | ICD-10-CM | POA: Diagnosis not present

## 2023-02-09 DIAGNOSIS — N1831 Chronic kidney disease, stage 3a: Secondary | ICD-10-CM | POA: Diagnosis not present

## 2023-02-09 DIAGNOSIS — G250 Essential tremor: Secondary | ICD-10-CM | POA: Diagnosis not present

## 2023-02-09 DIAGNOSIS — R7303 Prediabetes: Secondary | ICD-10-CM | POA: Diagnosis not present

## 2023-02-09 DIAGNOSIS — K589 Irritable bowel syndrome without diarrhea: Secondary | ICD-10-CM | POA: Diagnosis not present

## 2023-02-09 DIAGNOSIS — E785 Hyperlipidemia, unspecified: Secondary | ICD-10-CM | POA: Diagnosis not present

## 2023-02-09 DIAGNOSIS — F418 Other specified anxiety disorders: Secondary | ICD-10-CM | POA: Diagnosis not present

## 2023-02-09 DIAGNOSIS — E559 Vitamin D deficiency, unspecified: Secondary | ICD-10-CM | POA: Diagnosis not present

## 2023-03-30 ENCOUNTER — Encounter: Payer: Self-pay | Admitting: Physician Assistant

## 2023-03-30 ENCOUNTER — Ambulatory Visit (INDEPENDENT_AMBULATORY_CARE_PROVIDER_SITE_OTHER): Payer: 59 | Admitting: Physician Assistant

## 2023-03-30 DIAGNOSIS — K581 Irritable bowel syndrome with constipation: Secondary | ICD-10-CM | POA: Diagnosis not present

## 2023-03-30 DIAGNOSIS — K5900 Constipation, unspecified: Secondary | ICD-10-CM

## 2023-03-30 DIAGNOSIS — Z1211 Encounter for screening for malignant neoplasm of colon: Secondary | ICD-10-CM | POA: Diagnosis not present

## 2023-03-30 MED ORDER — CLENPIQ 10-3.5-12 MG-GM -GM/160ML PO SOLN: 1.0000 | ORAL | 0 refills | Status: DC

## 2023-03-30 MED ORDER — DICYCLOMINE HCL 10 MG PO CAPS: ORAL_CAPSULE | ORAL | 3 refills | Status: DC

## 2023-03-30 NOTE — Patient Instructions (Addendum)
_______________________________________________________  If your blood pressure at your visit was 140/90 or greater, please contact your primary care physician to follow up on this. _______________________________________________________  If you are age 65 or younger, your body mass index should be between 19-25. Your Body mass index is 26.95 kg/m. If this is out of the aformentioned range listed, please consider follow up with your Primary Care Provider.  ________________________________________________________  The [facility] providers would like to encourage you to use [facility] to communicate with providers for non-urgent requests or questions.  Due to long hold times on the telephone, sending your provider a message by [facility] may be a faster and more efficient way to get a response.  Please allow 48 business hours for a response.  Please remember that this is for non-urgent requests.  _______________________________________________________  We have sent the following medications to your pharmacy for you to pick up at your convenience:  START: Bentyl 10mg  one capsule every 6 to 8 hours as needed abdominal pain/cramping.  Please increase water intake to 60 ounces daily.  Please purchase the following medications over the counter and take as directed:  Benefiber one dose daily in 6 of 8 ounces of water.  Miralax 17 grams in 8 ounces of water every other day.  Recticare Advance as needed for hemorrhoids.   You have been scheduled for a colonoscopy. Please follow written instructions given to you at your visit today.  Please pick up your prep supplies at the pharmacy within the next 1-3 days. If you use inhalers (even only as needed), please bring them with you on the day of your procedure.  Due to recent changes in healthcare laws, you may see the results of your imaging and laboratory studies on MyChart before your provider has had a chance to review them.  We understand that in some  cases there may be results that are confusing or concerning to you. Not all laboratory results come back in the same time frame and the provider may be waiting for multiple results in order to interpret others.  Please give [place] 48 hours in order for your provider to thoroughly review all the results before contacting the office for clarification of your results.   Thank you for entrusting me with your care and choosing [facility].  Amy Esterwood, PA-C

## 2023-03-30 NOTE — Progress Notes (Signed)
Subjective:    Patient ID: [name], female    DOB: [birth year], 67 y.o.   MRN: [id number]  HPI  [name] is a pleasant 67 year old white female, previous patient of Dr. [name] who was last seen in the office in 2017 by Jennifer Cluster, NP.  She has history of IBS, GERD, hypothyroidism, hyperlipidemia, coronary artery disease history of SVT.Marland Kitchen She last underwent colonoscopy in August 2014 with findings of moderate sigmoid diverticulosis, terminal ileum was unable to be intubated, the appendiceal orifice appeared normal. She had also had colonoscopy in 2010 which was normal and random biopsies were done and unremarkable.  EGD in 2010 with finding of a small hiatal hernia moderate gastritis and duodenitis, biopsies negative for H. pylori and otherwise benign.  She comes in today stating that her IBS symptoms have been "horrible" for the past few months.  He is having more difficulty with constipation and is not sure how to manage this.  Constipation has aggravated which she feels is an external hemorrhoid which will be swollen and uncomfortable at times she has not had any significant bleeding.  She has been taking align on a regular basis is not sure that this makes much difference.  She is experiencing intermittent bloating and abdominal discomfort which may occur in different areas of her abdomen.  She is able to have a bowel movement most days but says she has a lot of straining and sometimes her stools are mushy, she takes MiraLAX if she does not have a bowel movement in 2 days.  Frequently does not pass much stool at a time. Family history is negative for colon cancer and IBD. She feels that beef, salads and spicy foods seem to trigger her symptoms with abdominal pain and also feels that stress plays a role. She has been taking care of her 65-year-old granddaughter a few days per week, there is some family stress involved with her daughter, patient says her husband has had ADHD and that is a  challenge as well.  She recently went on a trip with a friend to the beach and did not have any symptoms while she was there despite eating all sorts of foods and then her pain recurred the day they were to come home.    Review of Systems Pertinent positive and negative review of systems were noted in the above HPI section.  All other review of systems was otherwise negative.   Outpatient Encounter Medications as of 03/30/2023  Medication Sig   Cholecalciferol (VITAMIN D3) 2000 units TABS Take 2,000 Units by mouth every other day.   dicyclomine (BENTYL) 10 MG capsule Take 1 capsule every 6 to 8 hours as needed for abdominal pain/cramping   escitalopram (LEXAPRO) 10 MG tablet 1 tablet   fenofibrate (TRICOR) 145 MG tablet Take 145 mg by mouth every other day.   fluticasone furoate-vilanterol (BREO ELLIPTA) 100-25 MCG/ACT AEPB Inhale 1 puff into the lungs daily.   levothyroxine (SYNTHROID, LEVOTHROID) 112 MCG tablet Take 112 mcg by mouth daily before breakfast.   rosuvastatin (CRESTOR) 20 MG tablet TAKE 1 TABLET BY MOUTH DAILY   Sod Picosulfate-Mag Ox-Cit Acd (CLENPIQ) 10-3.5-12 MG-GM -GM/160ML SOLN Take 1 kit by mouth as directed.   [DISCONTINUED] ALPRAZolam (XANAX) 0.5 MG tablet    [DISCONTINUED] calcium citrate (CALCITRATE - DOSED IN MG ELEMENTAL CALCIUM) 950 (200 Ca) MG tablet Take 200 mg of elemental calcium by mouth as needed.   [DISCONTINUED] levocetirizine (XYZAL) 5 MG tablet Take 1 tablet (5 mg  total) by mouth every evening.   [DISCONTINUED] Multiple Vitamin (MULTIVITAMIN) capsule Take 1 capsule by mouth daily.   No facility-administered encounter medications on file as of 03/30/2023.   Allergies  Allergen Reactions   Codeine Nausea And Vomiting and Swelling   Patient Active Problem List   Diagnosis Date Noted   Claudication (HCC) 05/06/2022   Elevated coronary artery calcium score 09/12/2019   Dyslipidemia 09/12/2019   SVT (supraventricular tachycardia)    Hyperlipidemia     GERD (gastroesophageal reflux disease)    CAD (coronary artery disease)    Essential tremor 07/06/2017   Nonspecific (abnormal) findings on radiological and other examination of gastrointestinal tract 03/09/2012   RLQ abdominal pain 03/09/2012   Palpitations 04/29/2011   OTHER ACQUIRED ABSENCE OF ORGAN 01/08/2009   THYROID NODULE, RIGHT 01/07/2009   HYPOTHYROIDISM 01/07/2009   IRRITABLE BOWEL SYNDROME 01/07/2009   NEPHROLITHIASIS, HX OF 01/07/2009   HYPERTRIGLYCERIDEMIA 12/25/2008   HYPERLIPIDEMIA-MIXED 12/25/2008   SVT/ PSVT/ PAT 12/25/2008   CHEST PAIN-UNSPECIFIED 12/25/2008   HEARTBURN 12/25/2008   Social History   Socioeconomic History   Marital status: Married    Spouse name: Not on file   Number of children: Not on file   Years of education: Not on file   Highest education level: Not on file  Occupational History   Occupation: Not employed outside the home  Tobacco Use   Smoking status: Never   Smokeless tobacco: Never  Substance and Sexual Activity   Alcohol use: Yes    Comment: rare   Drug use: No   Sexual activity: Not on file  Other Topics Concern   Not on file  Social History Narrative   Lives [place]   2 adult children   Education College degree.     caffiene  2 glasses of tea   Social Determinants of Health   Financial Resource Strain: Not on file  Food Insecurity: Not on file  Transportation Needs: Not on file  Physical Activity: Not on file  Stress: Not on file  Social Connections: Not on file  Intimate Partner Violence: Not on file    [name]'s family history includes Bladder Cancer in her father; Coronary artery disease (age of onset: 43) in her mother; Dementia in her father and sister; Heart attack (age of onset: 61) in her father; Stroke (age of onset: 55) in her mother.      Objective:    Vitals:   03/30/23 1400  BP: 110/66  Pulse: 76    Physical Exam Well-developed well-nourished older WF  in no acute  distress.  Height, [id number]  BMI 26.9  HEENT; nontraumatic normocephalic, EOMI, PE R LA, sclera anicteric. Oropharynx; not examined today Neck; supple, no JVD Cardiovascular; regular rate and rhythm with S1-S2, no murmur rub or gallop Pulmonary; Clear bilaterally Abdomen; soft, nondistended no focal tenderness, low midline incisional scar, no palpable mass or hepatosplenomegaly, bowel sounds are active Rectal; not done today-patient to be scheduled for colonoscopy Skin; benign exam, no jaundice rash or appreciable lesions Extremities; no clubbing cyanosis or edema skin warm and dry Neuro/Psych; alert and oriented x4, grossly nonfocal mood and affect appropriate , nonintention tremor       Assessment & Plan:   #53 67 year old white female with history of IBS with  worsening symptoms over the past few months with increase in constipation, abdominal bloating and discomfort/pain. She usually has a bowel movement every 1 to 2 days, does not feel that she evacuates her bowel  well, may not pass much stool or have mushy stool. She has also been under a fair amount of stress recently which I believe is contributing to her current symptoms.  #2 colon cancer screening-last colonoscopy August 2014 negative with exception of moderate sigmoid diverticulosis-due for follow-up  #3 GERD-history, currently not requiring any medication.  #4 diverticulosis #5-negative exercise stress test 2022 #6 hyperlipidemia #7.  History of SVT  Plan; start Benefiber 1 scoop daily in 6 to 8 ounces of water Increase water intake to at least 60 ounces per day. We discussed using MiraLAX on a daily or every other day basis more regularly to improve bowel movements Start trial of Bentyl 10 mg p.o. every 6-8 hours as needed for abdominal pain/cramping. Avoid trigger foods, avoid sodas and artificial sweeteners Patient will be scheduled for colonoscopy with Dr. Barron Alvine.  Procedure was discussed in detail with the  patient including indications risk and benefits and she is agreeable to proceed. Further recommendations pending colonoscopy.  I have asked her to call and talk to my nurse if she does not have any improvement with the above measures.  Plan  Ulysses Alper Oswald Hillock PA-C 03/30/2023   Cc: Irena Reichmann, DO

## 2023-03-31 NOTE — Progress Notes (Signed)
Agree with the assessment and plan as outlined by Amy Esterwood, PA-C.  Iann Rodier, DO, FACG  

## 2023-05-01 NOTE — Progress Notes (Signed)
  Cardiology Office Note:   Date:  05/02/2023  ID:  [name], DOB 1956/06/19, MRN [id number] PCP: Irena Reichmann, DO  [facility] Providers Cardiologist:  None {  History of Present Illness:   [name] is a 67 y.o. female who presents for follow up of leg pain.   She had a cardiac cath with minimal coronary plaque in 2007.   She also had a past history of SVT s/p ablation in 2007 as well as WPW.   She was found to have elevated coronary calcium.   She had a negative POET (Plain Old Exercise Treadmill) in 2019 and again in 2022.  She had leg pain with exercise ABIs being mildly abnormal.  She was managed conservatively by Dr. Kirke Corin.     Since I last saw her she has done.  She has no treadmill.  She walks her dog twice a day. The patient denies any new symptoms such as chest discomfort, neck or arm discomfort. There has been no new shortness of breath, PND or orthopnea. There have been no reported palpitations, presyncope or syncope.    ROS: As stated in the HPI and negative for all other systems.  Studies Reviewed:    EKG:   EKG Interpretation Date/Time:  Monday May 02 2023 11:14:20 EDT Ventricular Rate:  72 PR Interval:  166 QRS Duration:  82 QT Interval:  398 QTC Calculation: 435 R Axis:   48  Text Interpretation: Normal sinus rhythm Normal ECG When compared with ECG of 10-Dec-2016 13:43, No significant change was found Confirmed by Rollene Rotunda ([id number]) on 05/02/2023 11:27:47 AM    Risk Assessment/Calculations:              Physical Exam:   VS:  BP 106/64 (BP Location: Left Arm, Patient Position: Sitting, Cuff Size: Normal)   Pulse 72   Ht 5\' 6"  (1.676 m)   Wt 168 lb (76.2 kg)   SpO2 96%   BMI 27.12 kg/m    Wt Readings from Last 3 Encounters:  05/02/23 168 lb (76.2 kg)  03/30/23 167 lb (75.8 kg)  05/07/22 170 lb 6.4 oz (77.3 kg)     GEN: Well nourished, well developed in no acute distress NECK: No JVD; No carotid bruits CARDIAC: RRR, no murmurs,  rubs, gallops RESPIRATORY:  Clear to auscultation without rales, wheezing or rhonchi  ABDOMEN: Soft, non-tender, non-distended EXTREMITIES:  No edema; No deformity   ASSESSMENT AND PLAN:   SVT:    She has had no further symptoms.  No change in therapy.  ELEVATED CORONARY CALCIUM:  Since her POET (Plain Old Exercise Treadmill) in 2022.  She will continue with risk reduction.   DYSLIPIDEMIA: LDL was 68 but this was a couple of years ago.  I do not have the most recent.  She will send me those results.  ELEVATED A1C:  was 6.1 this year.  No change in therapy 6.2.  No change in therapy.          Follow up me in one year.   Signed, Rollene Rotunda, MD

## 2023-05-02 ENCOUNTER — Encounter: Payer: Self-pay | Admitting: Cardiology

## 2023-05-02 ENCOUNTER — Ambulatory Visit: Payer: 59 | Attending: Cardiology | Admitting: Cardiology

## 2023-05-02 VITALS — BP 106/64 | HR 72 | Ht 66.0 in | Wt 168.0 lb

## 2023-05-02 DIAGNOSIS — R931 Abnormal findings on diagnostic imaging of heart and coronary circulation: Secondary | ICD-10-CM | POA: Diagnosis not present

## 2023-05-02 DIAGNOSIS — I471 Supraventricular tachycardia, unspecified: Secondary | ICD-10-CM | POA: Diagnosis not present

## 2023-05-02 DIAGNOSIS — E785 Hyperlipidemia, unspecified: Secondary | ICD-10-CM

## 2023-05-02 NOTE — Patient Instructions (Signed)
    Follow-Up: At [facility], you and your health needs are our priority.  As part of our continuing mission to provide you with exceptional heart care, we have created designated Provider Care Teams.  These Care Teams include your primary Cardiologist (physician) and Advanced Practice Providers (APPs -  Physician Assistants and Nurse Practitioners) who all work together to provide you with the care you need, when you need it.  We recommend signing up for the patient portal called "MyChart".  Sign up information is provided on this After Visit Summary.  MyChart is used to connect with patients for Virtual Visits (Telemedicine).  Patients are able to view lab/test results, encounter notes, upcoming appointments, etc.  Non-urgent messages can be sent to your provider as well.   To learn more about what you can do with MyChart, go to [email or web address].    Your next appointment:   12 month(s)  Provider:   James Hochrein MD   

## 2023-05-11 ENCOUNTER — Encounter: Payer: 59 | Admitting: Gastroenterology

## 2023-06-01 ENCOUNTER — Encounter: Payer: Self-pay | Admitting: Gastroenterology

## 2023-06-08 ENCOUNTER — Ambulatory Visit (AMBULATORY_SURGERY_CENTER): Payer: 59 | Admitting: Gastroenterology

## 2023-06-08 ENCOUNTER — Encounter: Payer: Self-pay | Admitting: Gastroenterology

## 2023-06-08 VITALS — BP 105/50 | HR 66 | Temp 98.5°F | Resp 19 | Ht 66.0 in | Wt 167.0 lb

## 2023-06-08 DIAGNOSIS — K5730 Diverticulosis of large intestine without perforation or abscess without bleeding: Secondary | ICD-10-CM | POA: Diagnosis not present

## 2023-06-08 DIAGNOSIS — D124 Benign neoplasm of descending colon: Secondary | ICD-10-CM | POA: Diagnosis not present

## 2023-06-08 DIAGNOSIS — D125 Benign neoplasm of sigmoid colon: Secondary | ICD-10-CM

## 2023-06-08 DIAGNOSIS — I2510 Atherosclerotic heart disease of native coronary artery without angina pectoris: Secondary | ICD-10-CM | POA: Diagnosis not present

## 2023-06-08 DIAGNOSIS — Z1211 Encounter for screening for malignant neoplasm of colon: Secondary | ICD-10-CM

## 2023-06-08 DIAGNOSIS — K581 Irritable bowel syndrome with constipation: Secondary | ICD-10-CM

## 2023-06-08 DIAGNOSIS — K5520 Angiodysplasia of colon without hemorrhage: Secondary | ICD-10-CM | POA: Diagnosis not present

## 2023-06-08 DIAGNOSIS — E785 Hyperlipidemia, unspecified: Secondary | ICD-10-CM | POA: Diagnosis not present

## 2023-06-08 DIAGNOSIS — F32A Depression, unspecified: Secondary | ICD-10-CM | POA: Diagnosis not present

## 2023-06-08 DIAGNOSIS — K641 Second degree hemorrhoids: Secondary | ICD-10-CM

## 2023-06-08 DIAGNOSIS — F419 Anxiety disorder, unspecified: Secondary | ICD-10-CM | POA: Diagnosis not present

## 2023-06-08 DIAGNOSIS — E039 Hypothyroidism, unspecified: Secondary | ICD-10-CM | POA: Diagnosis not present

## 2023-06-08 MED ORDER — SODIUM CHLORIDE 0.9 % IV SOLN: 500.0000 mL | Freq: Once | INTRAVENOUS | Status: DC

## 2023-06-08 NOTE — Op Note (Signed)
[facility] Patient Name: [name] Procedure Date: 06/08/2023 1:10 PM MRN: [id number] Endoscopist: Doristine Locks , MD, [id number] Age: 67 Referring MD:  Date of Birth: 26-Nov-1955 Gender: Female Account #: [id number] Procedure:                Colonoscopy Indications:              Screening for colorectal malignant neoplasm (last                            colonoscopy was 10 years ago)                           Last colonoscopy was in August 2014 with findings                            of moderate sigmoid diverticulosis                           Separately, history of IBS with recent exacerbation                            of constipation along with abdominal cramping,                            bloating, and generalized dicomfort. Also with                            symptomatic hemorrhoid. Medicines:                Monitored Anesthesia Care Procedure:                Pre-Anesthesia Assessment:                           - Prior to the procedure, a History and Physical                            was performed, and patient medications and                            allergies were reviewed. The patient's tolerance of                            previous anesthesia was also reviewed. The risks                            and benefits of the procedure and the sedation                            options and risks were discussed with the patient.                            All questions were answered, and informed consent  was obtained. Prior Anticoagulants: The patient has                            taken no anticoagulant or antiplatelet agents. ASA                            Grade Assessment: III - A patient with severe                            systemic disease. After reviewing the risks and                            benefits, the patient was deemed in satisfactory                            condition to undergo the procedure.                            After obtaining informed consent, the colonoscope                            was passed under direct vision. Throughout the                            procedure, the patient's blood pressure, pulse, and                            oxygen saturations were monitored continuously. The                            Olympus Scope [id number] was introduced through the                            anus and advanced to the the cecum, identified by                            appendiceal orifice and ileocecal valve. The                            colonoscopy was performed without difficulty. The                            patient tolerated the procedure well. The quality                            of the bowel preparation was good. The ileocecal                            valve, appendiceal orifice, and rectum were                            photographed. Scope In: 1:23:25 PM Scope Out: 1:39:58 PM Scope Withdrawal Time: 0 hours 13 minutes 13 seconds  Total Procedure Duration: 0 hours 16  minutes 33 seconds  Findings:                 Skin tags were found on perianal exam.                           Three sessile polyps were found in the sigmoid                            colon and descending colon. The polyps were 3 to 5                            mm in size. These polyps were removed with a cold                            snare. Resection and retrieval were complete.                            Estimated blood loss was minimal.                           Multiple medium-mouthed and small-mouthed                            diverticula were found in the sigmoid colon.                           A few small angioectasias with typical arborization                            were found in the sigmoid colon, in the descending                            colon and in the transverse colon.                           Non-bleeding internal hemorrhoids were found during                            retroflexion. The  hemorrhoids were Grade II                            (internal hemorrhoids that prolapse but reduce                            spontaneously). Complications:            No immediate complications. Estimated Blood Loss:     Estimated blood loss was minimal. Impression:               - Perianal skin tags found on perianal exam.                           - Three 3 to 5 mm polyps in the sigmoid colon and  in the descending colon, removed with a cold snare.                            Resected and retrieved.                           - Diverticulosis in the sigmoid colon.                           - A few colonic angioectasias.                           - Non-bleeding internal hemorrhoids. Recommendation:           - Patient has a contact number available for                            emergencies. The signs and symptoms of potential                            delayed complications were discussed with the                            patient. Return to normal activities tomorrow.                            Written discharge instructions were provided to the                            patient.                           - Resume previous diet.                           - Continue present medications.                           - Await pathology results.                           - Repeat colonoscopy for surveillance based on                            pathology results.                           - Use fiber, for example Citrucel, Fibercon, Konsyl                            or Metamucil.                           - Internal hemorrhoids were noted on this study and                            may be amenable to hemorrhoid band ligation. If you  are interested in further treatment of these                            hemorrhoids with band ligation, please contact my                            clinic to set up an appointment for evaluation and                             treatment. Doristine Locks, MD 06/08/2023 1:49:26 PM

## 2023-06-08 NOTE — Progress Notes (Signed)
GASTROENTEROLOGY PROCEDURE H&P NOTE   Primary Care Physician: Irena Reichmann, DO    Reason for Procedure:  Constipation, IBS, symptomatic hemorrhoids, bloating, generalized abdominal discomfort, change in bowel habits, colon cancer screening  Plan:    Colonoscopy  Patient is appropriate for endoscopic procedure(s) in the ambulatory ([facility]) setting.  The nature of the procedure, as well as the risks, benefits, and alternatives were carefully and thoroughly reviewed with the patient. Ample time for discussion and questions allowed. The patient understood, was satisfied, and agreed to proceed.     HPI: [name] is a 67 y.o. female who presents for colonoscopy for colon cancer screening, but also for evaluation of change in bowel habits with constipation, bloating, generalized abdominal discomfort, and evaluation of symptomatic hemorrhoids.   Last colonoscopy was in August 2014 with findings of moderate sigmoid diverticulosis, terminal ileum was unable to be intubated, the appendiceal orifice appeared normal. She had also had colonoscopy in 2010 which was normal and random biopsies were done and unremarkable.  EGD in 2010 with finding of a small hiatal hernia moderate gastritis and duodenitis, biopsies negative for H. pylori and otherwise benign.  Past Medical History:  Diagnosis Date   Anxiety    CAD (coronary artery disease)    minimal plaque by cath 4/07: mLAD 30%, EF 60%   Depression    Essential tremor    GERD (gastroesophageal reflux disease)    Hepatic steatosis 03/08/2012   Hiatal hernia    History of IBS    History of kidney stones    Hyperglycemia    Borderline   Hyperlipidemia    Hypothyroidism    Right thyroid nodule     s/p thyroid biopsy; report states follicular epitehlial cells, histiocytes, colloid, and lymphocytes (thyroiditis). Suggest a non-neoplastic process such as a hyperplastic nodule/ non-neoplastic goiter in a background of thyroiditis. Thyroid  biopsy was done under a thyroid ultrasound at [facility].   SVT (supraventricular tachycardia)    Hx of, s/p EP study with catheter ablation of AV nodal; re-entry tachycardia 01/10/06, s/p adenosine myoview, 10/06 showing an EF of 70% with no ischemia   Wolff-Parkinson-White (WPW) pattern     Past Surgical History:  Procedure Laterality Date   ABLATION     SVT   BIOPSY THYROID     CARDIAC ELECTROPHYSIOLOGY MAPPING AND ABLATION  2007   CESAREAN SECTION     x2 via lower midline   EXTRACORPOREAL SHOCK WAVE LITHOTRIPSY Left 12/13/2016   Procedure: LEFT EXTRACORPOREAL SHOCK WAVE LITHOTRIPSY (ESWL);  Surgeon: Jerilee Field, MD;  Location: [facility];  Service: Urology;  Laterality: Left;   LAPAROSCOPIC CHOLECYSTECTOMY W/ CHOLANGIOGRAPHY     TOTAL THYROIDECTOMY  11/08/2007   TUBAL LIGATION      Prior to Admission medications   Medication Sig Start Date End Date Taking? Authorizing Provider  escitalopram (LEXAPRO) 10 MG tablet 1 tablet   Yes [provider]  fenofibrate (TRICOR) 145 MG tablet Take 145 mg by mouth every other day.   Yes [provider]  levothyroxine (SYNTHROID, LEVOTHROID) 112 MCG tablet Take 112 mcg by mouth daily before breakfast.   Yes [provider]  rosuvastatin (CRESTOR) 20 MG tablet TAKE 1 TABLET BY MOUTH DAILY 09/20/22  Yes Rollene Rotunda, MD  Cholecalciferol (VITAMIN D3) 2000 units TABS Take 2,000 Units by mouth every other day.    [provider]  estradiol (ESTRACE) 0.1 MG/GM vaginal cream Insert 1 g twice a week by vaginal route at bedtime. 11/10/22  [provider]    Current Outpatient Medications  Medication Sig Dispense Refill   escitalopram (LEXAPRO) 10 MG tablet 1 tablet     fenofibrate (TRICOR) 145 MG tablet Take 145 mg by mouth every other day.     levothyroxine (SYNTHROID, LEVOTHROID) 112 MCG tablet Take 112 mcg by mouth daily before breakfast.     rosuvastatin (CRESTOR) 20 MG tablet TAKE 1  TABLET BY MOUTH DAILY 90 tablet 3   Cholecalciferol (VITAMIN D3) 2000 units TABS Take 2,000 Units by mouth every other day.     estradiol (ESTRACE) 0.1 MG/GM vaginal cream Insert 1 g twice a week by vaginal route at bedtime.     Current Facility-Administered Medications  Medication Dose Route Frequency Provider Last Rate Last Admin   0.9 %  sodium chloride infusion  500 mL Intravenous Once Atarah Cadogan V, DO        Allergies as of 06/08/2023 - Review Complete 06/08/2023  Allergen Reaction Noted   Codeine Nausea And Vomiting and Swelling 12/25/2008    Family History  Problem Relation Age of Onset   Coronary artery disease Mother 53       CABG   Stroke Mother 72   Heart attack Father 58       MI x2 and had CABG   Bladder Cancer Father    Dementia Father    Dementia Sister        early onset frontal lobe   Colon cancer Neg Hx    Esophageal cancer Neg Hx    Stomach cancer Neg Hx     Social History   Socioeconomic History   Marital status: Married    Spouse name: Not on file   Number of children: Not on file   Years of education: Not on file   Highest education level: Not on file  Occupational History   Occupation: Not employed outside the home  Tobacco Use   Smoking status: Never   Smokeless tobacco: Never  Substance and Sexual Activity   Alcohol use: Yes    Comment: rare   Drug use: No   Sexual activity: Not on file  Other Topics Concern   Not on file  Social History Narrative   Lives in [place] with husband   2 adult children   Education College degree.     caffiene  2 glasses of tea   Social Determinants of Health   Financial Resource Strain: Not on file  Food Insecurity: Not on file  Transportation Needs: Not on file  Physical Activity: Not on file  Stress: Not on file  Social Connections: Not on file  Intimate Partner Violence: Not on file    Physical Exam: Vital signs in last 24 hours: @BP  (!) 142/81   Pulse 77   Temp 98.5 F (36.9 C)    Ht 5\' 6"  (1.676 m)   Wt 167 lb (75.8 kg)   SpO2 92%   BMI 26.95 kg/m  GEN: NAD EYE: Sclerae anicteric ENT: MMM CV: Non-tachycardic Pulm: CTA b/l GI: Soft, NT/ND NEURO:  Alert & Oriented x 3   Doristine Locks, DO [facility]   06/08/2023 1:11 PM

## 2023-06-08 NOTE — Progress Notes (Signed)
Called to room to assist during endoscopic procedure.  Patient ID and intended procedure confirmed with present staff. Received instructions for my participation in the procedure from the performing physician.  

## 2023-06-08 NOTE — Patient Instructions (Signed)
Please read handouts provided. Continue present medications. Await pathology results. Consider a fiber supplement.   YOU HAD AN ENDOSCOPIC PROCEDURE TODAY AT THE [facility]:   Refer to the procedure report that was given to you for any specific questions about what was found during the examination.  If the procedure report does not answer your questions, please call your gastroenterologist to clarify.  If you requested that your care partner not be given the details of your procedure findings, then the procedure report has been included in a sealed envelope for you to review at your convenience later.  YOU SHOULD EXPECT: Some feelings of bloating in the abdomen. Passage of more gas than usual.  Walking can help get rid of the air that was put into your GI tract during the procedure and reduce the bloating. If you had a lower endoscopy (such as a colonoscopy or flexible sigmoidoscopy) you may notice spotting of blood in your stool or on the toilet paper. If you underwent a bowel prep for your procedure, you may not have a normal bowel movement for a few days.  Please Note:  You might notice some irritation and congestion in your nose or some drainage.  This is from the oxygen used during your procedure.  There is no need for concern and it should clear up in a day or so.  SYMPTOMS TO REPORT IMMEDIATELY:  Following lower endoscopy (colonoscopy or flexible sigmoidoscopy):  Excessive amounts of blood in the stool  Significant tenderness or worsening of abdominal pains  Swelling of the abdomen that is new, acute  Fever of 100F or higher  For urgent or emergent issues, a gastroenterologist can be reached at any hour by calling [phone]. Do not use MyChart messaging for urgent concerns.    DIET:  We do recommend a small meal at first, but then you may proceed to your regular diet.  Drink plenty of fluids but you should avoid alcoholic beverages for 24 hours.  ACTIVITY:  You  should plan to take it easy for the rest of today and you should NOT DRIVE or use heavy machinery until tomorrow (because of the sedation medicines used during the test).    FOLLOW UP: Our staff will call the number listed on your records the next business day following your procedure.  We will call around 7:15- 8:00 am to check on you and address any questions or concerns that you may have regarding the information given to you following your procedure. If we do not reach you, we will leave a message.     If any biopsies were taken you will be contacted by phone or by letter within the next 1-3 weeks.  Please call us at [phone] if you have not heard about the biopsies in 3 weeks.    SIGNATURES/CONFIDENTIALITY: You and/or your care partner have signed paperwork which will be entered into your electronic medical record.  These signatures attest to the fact that that the information above on your After Visit Summary has been reviewed and is understood.  Full responsibility of the confidentiality of this discharge information lies with you and/or your care-partner.

## 2023-06-08 NOTE — Progress Notes (Signed)
Sedate, gd SR, tolerated procedure well, VSS, report to RN 

## 2023-06-09 ENCOUNTER — Telehealth: Payer: Self-pay | Admitting: *Deleted

## 2023-06-09 NOTE — Telephone Encounter (Signed)
  Follow up Call-     06/08/2023   12:42 PM  Call back number  Post procedure Call Back phone  # [phone]  Permission to leave phone message Yes     Patient questions:  Do you have a fever, pain , or abdominal swelling? No. Pain Score  0 *  Have you tolerated food without any problems? Yes.    Have you been able to return to your normal activities? Yes.    Do you have any questions about your discharge instructions: Diet   No. Medications  No. Follow up visit  No.  Do you have questions or concerns about your Care? No.  Actions: * If pain score is 4 or above: No action needed, pain <4.

## 2023-08-17 DIAGNOSIS — N1831 Chronic kidney disease, stage 3a: Secondary | ICD-10-CM | POA: Diagnosis not present

## 2023-08-17 DIAGNOSIS — E785 Hyperlipidemia, unspecified: Secondary | ICD-10-CM | POA: Diagnosis not present

## 2023-08-17 DIAGNOSIS — E559 Vitamin D deficiency, unspecified: Secondary | ICD-10-CM | POA: Diagnosis not present

## 2023-08-17 DIAGNOSIS — E039 Hypothyroidism, unspecified: Secondary | ICD-10-CM | POA: Diagnosis not present

## 2023-08-17 DIAGNOSIS — R7303 Prediabetes: Secondary | ICD-10-CM | POA: Diagnosis not present

## 2023-08-24 DIAGNOSIS — G250 Essential tremor: Secondary | ICD-10-CM | POA: Diagnosis not present

## 2023-08-24 DIAGNOSIS — R7303 Prediabetes: Secondary | ICD-10-CM | POA: Diagnosis not present

## 2023-08-24 DIAGNOSIS — E785 Hyperlipidemia, unspecified: Secondary | ICD-10-CM | POA: Diagnosis not present

## 2023-08-24 DIAGNOSIS — Z0000 Encounter for general adult medical examination without abnormal findings: Secondary | ICD-10-CM | POA: Diagnosis not present

## 2023-08-24 DIAGNOSIS — E039 Hypothyroidism, unspecified: Secondary | ICD-10-CM | POA: Diagnosis not present

## 2023-08-24 DIAGNOSIS — K589 Irritable bowel syndrome without diarrhea: Secondary | ICD-10-CM | POA: Diagnosis not present

## 2023-08-24 DIAGNOSIS — N1831 Chronic kidney disease, stage 3a: Secondary | ICD-10-CM | POA: Diagnosis not present

## 2023-08-24 DIAGNOSIS — F418 Other specified anxiety disorders: Secondary | ICD-10-CM | POA: Diagnosis not present

## 2023-08-24 DIAGNOSIS — I471 Supraventricular tachycardia, unspecified: Secondary | ICD-10-CM | POA: Diagnosis not present

## 2023-08-24 DIAGNOSIS — E559 Vitamin D deficiency, unspecified: Secondary | ICD-10-CM | POA: Diagnosis not present

## 2023-09-13 ENCOUNTER — Other Ambulatory Visit: Payer: Self-pay | Admitting: Cardiology

## 2023-09-13 DIAGNOSIS — E785 Hyperlipidemia, unspecified: Secondary | ICD-10-CM

## 2023-09-13 MED ORDER — ROSUVASTATIN CALCIUM 20 MG PO TABS
ORAL_TABLET | ORAL | 2 refills | Status: DC
Start: 2023-09-13 — End: 2024-05-07

## 2023-09-20 DIAGNOSIS — Z1231 Encounter for screening mammogram for malignant neoplasm of breast: Secondary | ICD-10-CM | POA: Diagnosis not present

## 2023-09-22 DIAGNOSIS — G250 Essential tremor: Secondary | ICD-10-CM | POA: Diagnosis not present

## 2023-09-22 DIAGNOSIS — E039 Hypothyroidism, unspecified: Secondary | ICD-10-CM | POA: Diagnosis not present

## 2023-09-22 DIAGNOSIS — Z79899 Other long term (current) drug therapy: Secondary | ICD-10-CM | POA: Diagnosis not present

## 2023-09-22 DIAGNOSIS — R7303 Prediabetes: Secondary | ICD-10-CM | POA: Diagnosis not present

## 2023-09-22 DIAGNOSIS — E559 Vitamin D deficiency, unspecified: Secondary | ICD-10-CM | POA: Diagnosis not present

## 2023-09-22 DIAGNOSIS — E785 Hyperlipidemia, unspecified: Secondary | ICD-10-CM | POA: Diagnosis not present

## 2023-11-03 DIAGNOSIS — R059 Cough, unspecified: Secondary | ICD-10-CM | POA: Diagnosis not present

## 2023-11-03 DIAGNOSIS — J101 Influenza due to other identified influenza virus with other respiratory manifestations: Secondary | ICD-10-CM | POA: Diagnosis not present

## 2023-11-14 DIAGNOSIS — Z01411 Encounter for gynecological examination (general) (routine) with abnormal findings: Secondary | ICD-10-CM | POA: Diagnosis not present

## 2023-11-14 DIAGNOSIS — N952 Postmenopausal atrophic vaginitis: Secondary | ICD-10-CM | POA: Diagnosis not present

## 2023-12-05 DIAGNOSIS — T8189XA Other complications of procedures, not elsewhere classified, initial encounter: Secondary | ICD-10-CM | POA: Diagnosis not present

## 2024-01-12 DIAGNOSIS — L309 Dermatitis, unspecified: Secondary | ICD-10-CM | POA: Diagnosis not present

## 2024-01-12 DIAGNOSIS — Z0189 Encounter for other specified special examinations: Secondary | ICD-10-CM | POA: Diagnosis not present

## 2024-01-12 DIAGNOSIS — I781 Nevus, non-neoplastic: Secondary | ICD-10-CM | POA: Diagnosis not present

## 2024-02-01 DIAGNOSIS — D485 Neoplasm of uncertain behavior of skin: Secondary | ICD-10-CM | POA: Diagnosis not present

## 2024-02-01 DIAGNOSIS — R768 Other specified abnormal immunological findings in serum: Secondary | ICD-10-CM | POA: Diagnosis not present

## 2024-02-01 DIAGNOSIS — L309 Dermatitis, unspecified: Secondary | ICD-10-CM | POA: Diagnosis not present

## 2024-02-01 DIAGNOSIS — W57XXXA Bitten or stung by nonvenomous insect and other nonvenomous arthropods, initial encounter: Secondary | ICD-10-CM | POA: Diagnosis not present

## 2024-02-01 DIAGNOSIS — I781 Nevus, non-neoplastic: Secondary | ICD-10-CM | POA: Diagnosis not present

## 2024-02-20 DIAGNOSIS — L910 Hypertrophic scar: Secondary | ICD-10-CM | POA: Diagnosis not present

## 2024-03-14 DIAGNOSIS — Z79899 Other long term (current) drug therapy: Secondary | ICD-10-CM | POA: Diagnosis not present

## 2024-03-14 DIAGNOSIS — E559 Vitamin D deficiency, unspecified: Secondary | ICD-10-CM | POA: Diagnosis not present

## 2024-03-14 DIAGNOSIS — E785 Hyperlipidemia, unspecified: Secondary | ICD-10-CM | POA: Diagnosis not present

## 2024-03-14 DIAGNOSIS — R7303 Prediabetes: Secondary | ICD-10-CM | POA: Diagnosis not present

## 2024-03-14 DIAGNOSIS — E039 Hypothyroidism, unspecified: Secondary | ICD-10-CM | POA: Diagnosis not present

## 2024-03-21 DIAGNOSIS — Z0000 Encounter for general adult medical examination without abnormal findings: Secondary | ICD-10-CM | POA: Diagnosis not present

## 2024-03-21 DIAGNOSIS — R768 Other specified abnormal immunological findings in serum: Secondary | ICD-10-CM | POA: Diagnosis not present

## 2024-03-21 DIAGNOSIS — R5383 Other fatigue: Secondary | ICD-10-CM | POA: Diagnosis not present

## 2024-03-21 DIAGNOSIS — E039 Hypothyroidism, unspecified: Secondary | ICD-10-CM | POA: Diagnosis not present

## 2024-03-21 DIAGNOSIS — R8290 Unspecified abnormal findings in urine: Secondary | ICD-10-CM | POA: Diagnosis not present

## 2024-03-21 DIAGNOSIS — Z6828 Body mass index (BMI) 28.0-28.9, adult: Secondary | ICD-10-CM | POA: Diagnosis not present

## 2024-03-21 DIAGNOSIS — F329 Major depressive disorder, single episode, unspecified: Secondary | ICD-10-CM | POA: Diagnosis not present

## 2024-03-21 DIAGNOSIS — Z23 Encounter for immunization: Secondary | ICD-10-CM | POA: Diagnosis not present

## 2024-03-21 DIAGNOSIS — E785 Hyperlipidemia, unspecified: Secondary | ICD-10-CM | POA: Diagnosis not present

## 2024-03-21 DIAGNOSIS — I781 Nevus, non-neoplastic: Secondary | ICD-10-CM | POA: Diagnosis not present

## 2024-03-21 DIAGNOSIS — R682 Dry mouth, unspecified: Secondary | ICD-10-CM | POA: Diagnosis not present

## 2024-03-21 DIAGNOSIS — R718 Other abnormality of red blood cells: Secondary | ICD-10-CM | POA: Diagnosis not present

## 2024-03-21 DIAGNOSIS — R7303 Prediabetes: Secondary | ICD-10-CM | POA: Diagnosis not present

## 2024-03-21 DIAGNOSIS — E663 Overweight: Secondary | ICD-10-CM | POA: Diagnosis not present

## 2024-03-21 DIAGNOSIS — D582 Other hemoglobinopathies: Secondary | ICD-10-CM | POA: Diagnosis not present

## 2024-03-21 DIAGNOSIS — R21 Rash and other nonspecific skin eruption: Secondary | ICD-10-CM | POA: Diagnosis not present

## 2024-03-21 DIAGNOSIS — G250 Essential tremor: Secondary | ICD-10-CM | POA: Diagnosis not present

## 2024-03-22 ENCOUNTER — Encounter: Payer: Self-pay | Admitting: Cardiology

## 2024-03-22 DIAGNOSIS — L821 Other seborrheic keratosis: Secondary | ICD-10-CM | POA: Diagnosis not present

## 2024-03-22 DIAGNOSIS — Z712 Person consulting for explanation of examination or test findings: Secondary | ICD-10-CM | POA: Diagnosis not present

## 2024-03-22 DIAGNOSIS — Z5189 Encounter for other specified aftercare: Secondary | ICD-10-CM | POA: Diagnosis not present

## 2024-04-12 DIAGNOSIS — I781 Nevus, non-neoplastic: Secondary | ICD-10-CM | POA: Diagnosis not present

## 2024-04-12 DIAGNOSIS — Z6828 Body mass index (BMI) 28.0-28.9, adult: Secondary | ICD-10-CM | POA: Diagnosis not present

## 2024-04-12 DIAGNOSIS — R21 Rash and other nonspecific skin eruption: Secondary | ICD-10-CM | POA: Diagnosis not present

## 2024-04-12 DIAGNOSIS — R768 Other specified abnormal immunological findings in serum: Secondary | ICD-10-CM | POA: Diagnosis not present

## 2024-04-12 DIAGNOSIS — R682 Dry mouth, unspecified: Secondary | ICD-10-CM | POA: Diagnosis not present

## 2024-04-12 DIAGNOSIS — E663 Overweight: Secondary | ICD-10-CM | POA: Diagnosis not present

## 2024-05-07 ENCOUNTER — Other Ambulatory Visit: Payer: Self-pay

## 2024-05-07 DIAGNOSIS — E785 Hyperlipidemia, unspecified: Secondary | ICD-10-CM

## 2024-05-07 MED ORDER — ROSUVASTATIN CALCIUM 20 MG PO TABS: ORAL_TABLET | ORAL | 0 refills | Status: DC

## 2024-05-16 NOTE — Progress Notes (Unsigned)
 Cardiology Office Note:   Date:  05/17/2024  ID:  [name], DOB Mar 04, 1956, MRN [id number] PCP: Gerome Brunet, DO  [facility] Providers Cardiologist:  None {  History of Present Illness:   [name] is a 68 y.o. female who presents for follow up of leg pain.   She had a cardiac cath with minimal coronary plaque in 2007.   She also had a past history of SVT s/p ablation in 2007 as well as WPW.   She was found to have elevated coronary calcium .   She had a negative POET (Plain Old Exercise Treadmill) in 2019 and again in 2022.  She had leg pain with exercise ABIs being mildly abnormal.  She was managed conservatively by Dr. Darron.     Since I last saw her she has had no new cardiovascular complaints.  She takes care of her 19-year-old granddaughter few times a week.  She is not exercising as much as I would like.  The patient denies any new symptoms such as chest discomfort, neck or arm discomfort. There has been no new shortness of breath, PND or orthopnea. There have been no reported palpitations, presyncope or syncope.      ROS: As stated in the HPI and negative for all other systems.  Studies Reviewed:    EKG:   EKG Interpretation Date/Time:  Thursday May 17 2024 15:05:10 EDT Ventricular Rate:  83 PR Interval:  170 QRS Duration:  80 QT Interval:  360 QTC Calculation: 423 R Axis:   20  Text Interpretation: Normal sinus rhythm Normal ECG When compared with ECG of 02-May-2023 11:14, No significant change was found Confirmed by Lavona Agent ([id number]) on 05/17/2024 3:33:23 PM    Risk Assessment/Calculations:              Physical Exam:   VS:  BP 112/73 (BP Location: Left Arm, Patient Position: Sitting, Cuff Size: Normal)   Pulse 76   Resp 16   Ht 5' 6 (1.676 m)   Wt 170 lb 12.8 oz (77.5 kg)   SpO2 95%   BMI 27.57 kg/m    Wt Readings from Last 3 Encounters:  05/17/24 170 lb 12.8 oz (77.5 kg)  06/08/23 167 lb (75.8 kg)  05/02/23 168 lb (76.2 kg)      GEN: Well nourished, well developed in no acute distress NECK: No JVD; No carotid bruits CARDIAC: RRR, no murmurs, rubs, gallops RESPIRATORY:  Clear to auscultation without rales, wheezing or rhonchi  ABDOMEN: Soft, non-tender, non-distended EXTREMITIES:  No edema; No deformity   ASSESSMENT AND PLAN:   SVT:    She has had no further symptoms.  No change in therapy.   ELEVATED CORONARY CALCIUM :  Since her POET (Plain Old Exercise Treadmill) in 2022 she has had no new symptoms.  We talked about risk reduction.  We talked about the symptoms that would prompt further evaluation.  No change in therapy.  No further testing.  She will call me if she has symptoms in the future or call 911   DYSLIPIDEMIA: LDL was 64 with an HDL of 17.  No change in therapy.  I would suggest for her primary care doctor that she get an LP(a) and I will be happy to review this in the future.  ELEVATED A1C:   A1c was 6.1.  No change in therapy.  This is followed by her primary provider.   Follow up with me since in 3 years or sooner if needed.  Signed, Lynwood Schilling, MD

## 2024-05-17 ENCOUNTER — Ambulatory Visit: Payer: Self-pay | Attending: Cardiology | Admitting: Cardiology

## 2024-05-17 ENCOUNTER — Encounter: Payer: Self-pay | Admitting: Cardiology

## 2024-05-17 VITALS — BP 112/73 | HR 76 | Resp 16 | Ht 66.0 in | Wt 170.8 lb

## 2024-05-17 DIAGNOSIS — I471 Supraventricular tachycardia, unspecified: Secondary | ICD-10-CM

## 2024-05-17 DIAGNOSIS — E785 Hyperlipidemia, unspecified: Secondary | ICD-10-CM | POA: Diagnosis not present

## 2024-05-17 DIAGNOSIS — R931 Abnormal findings on diagnostic imaging of heart and coronary circulation: Secondary | ICD-10-CM | POA: Diagnosis not present

## 2024-05-17 NOTE — Patient Instructions (Signed)
 Medication Instructions:  Your physician recommends that you continue on your current medications as directed. Please refer to the Current Medication list given to you today.  *If you need a refill on your cardiac medications before your next appointment, please call your pharmacy*  Lab Work: NONE If you have labs (blood work) drawn today and your tests are completely normal, you will receive your results only by: MyChart Message (if you have MyChart) OR A paper copy in the mail If you have any lab test that is abnormal or we need to change your treatment, we will call you to review the results.  Testing/Procedures: NONE  Follow-Up: At [facility], you and your health needs are our priority.  As part of our continuing mission to provide you with exceptional heart care, our providers are all part of one team.  This team includes your primary Cardiologist (physician) and Advanced Practice Providers or APPs (Physician Assistants and Nurse Practitioners) who all work together to provide you with the care you need, when you need it.  Your next appointment:   3 year(s)  Provider:   Lavonne Prairie, MD  We recommend signing up for the patient portal called "MyChart".  Sign up information is provided on this After Visit Summary.  MyChart is used to connect with patients for Virtual Visits (Telemedicine).  Patients are able to view lab/test results, encounter notes, upcoming appointments, etc.  Non-urgent messages can be sent to your provider as well.   To learn more about what you can do with MyChart, go to [email or web address].

## 2024-05-29 DIAGNOSIS — K1120 Sialoadenitis, unspecified: Secondary | ICD-10-CM | POA: Diagnosis not present

## 2024-06-13 DIAGNOSIS — H2513 Age-related nuclear cataract, bilateral: Secondary | ICD-10-CM | POA: Diagnosis not present

## 2024-07-09 ENCOUNTER — Telehealth: Payer: Self-pay | Admitting: Gastroenterology

## 2024-07-09 DIAGNOSIS — K5900 Constipation, unspecified: Secondary | ICD-10-CM | POA: Diagnosis not present

## 2024-07-09 DIAGNOSIS — R109 Unspecified abdominal pain: Secondary | ICD-10-CM | POA: Diagnosis not present

## 2024-07-09 NOTE — Telephone Encounter (Signed)
 Lm on vm for patient to return call

## 2024-07-09 NOTE — Telephone Encounter (Signed)
 Patient scheduled for 10/2, is requesting fu call with nurse to discuss solutions with pain. Please review and advise  Thank you

## 2024-07-10 NOTE — Telephone Encounter (Signed)
 Called and spoke with patient. Patient reports that last Friday after dinner she experienced pain from the bottom of her esophagus to under her breasts. Patient states that she felt like she was having a heart attack. Patient states that her abdomen was bloated and hard. Patient reports IBS-C, has only been having watery diarrhea for about 1 week. Patient saw PCP yesterday and they performed an x-ray, per patient there was no obstruction but MD did not mention any stool burden. Not sure if patient has overflow diarrhea or not. Patient states that she started taking Miralax  Saturday and had somewhat of a regular BM yesterday. As of today, pain has improved some. Pain is not completely resolved, lower abdomen is tender to touch. Patient's appt has been rescheduled to Thursday, 07/12/24 at 8:40 am. Patient verbalized understanding and had no additional concerns.

## 2024-07-11 NOTE — Progress Notes (Unsigned)
 07/12/2024 [name] [id number] 07/27/1956  Referring provider: Gerome Brunet, DO Primary GI doctor: Dr. San  ASSESSMENT AND PLAN:  Diarrhea and then constipation x 1 weeks, AB bloating, nausea without vomiting, decreased gas, RLQ pain on exam without rebound Started on miralax  with some help Has had 2 Csections, cholecystectomy  KUB last week negative for obstruction Some chills, some concern for infection Intermittent abdominal pain with bloating and alternating diarrhea and constipation, localized to the right lower quadrant. Differential includes diverticulitis, appendicitis, given history of diverticulosis and current symptoms. - Order stool studies for infection and inflammation. - Order CT scan STAT of the abdomen for diverticulitis. - Prescribe Augmentin  for potential diverticulitis. - Provided dietary management information for diverticulitis.  Epigastric pain with history of GERD EGD 2010 with finding of a small hiatal hernia moderate gastritis and duodenitis, biopsies negative for H. pylori and otherwise benign   Continue protonix Consider repeat EGD if CT is negative   CAD Unremarkable stress test 2022  History of SVT No symptoms at this time  Screening colonoscopy 06/08/2023 colonoscopy good prep.  No skin tags 3 TA polyps 3 to 5 mm sigmoid descending colon, diverticulosis sigmoid colon a few colonic AVMs, grade 2 internal hemorrhoids Recall 06/14/2028  Hepatic steatosis seen on CT AP WO 2017    Latest Ref Rng & Units 06/09/2021   11:02 AM 05/08/2013    5:00 PM 02/24/2009   11:12 AM  Hepatic Function  Total Protein 6.0 - 8.5 g/dL 6.8  7.9  7.6   Albumin 3.8 - 4.8 g/dL 4.2  4.3  4.2   AST 0 - 40 IU/L 27  37  35   ALT 0 - 32 IU/L 20  50  39   Alk Phosphatase 44 - 121 IU/L 47  54  44   Total Bilirubin 0.0 - 1.2 mg/dL 0.4  0.6  0.5   Bilirubin, Direct 0.00 - 0.40 mg/dL 9.85   0.1   - Check liver enzymes - If enzymes elevated, may consider additional  serologic work-up to rule out concomitant liver disease - Depending on above evaluation and FIB-4, can consider ultrasound elastography to follow-up but patient is getting a CT of AB - need LFTs and CBC monitored every 6 months, - evaluation with imaging every 2-3 years.  - Encouraged diet/exercise for modest 10% body weight loss as treatment for hepatic steatosis -Continue to work on risk factor modification including diet exercise and control of risk factors including blood sugars.   Patient Care Team: Collins, Dana, DO as PCP - General (Family Medicine)  HISTORY OF PRESENT ILLNESS: 68 y.o. female with a past medical history of IBS, GERD, hypothyroidism, hyperlipidemia, coronary artery disease history of SVT and others listed below presents for evaluation of acute AB pain, bloating, change in bowels.   Last seen in the office 03/30/2023 by PA Amy Esterwood  Discussed the use of AI scribe software for clinical note transcription with the patient, who gave verbal consent to proceed.  History of Present Illness   [name] is a 68 year old female with IBS and diverticulosis who presents with abdominal pain and bloating.  She has been experiencing a severe sensation of heartburn since Friday night after consuming vegetables. The pain is located under her chest and radiates upwards. Since then, she has experienced bloating and intermittent abdominal pain, describing her stomach as 'bloated and hard,' with pain that 'comes and goes.'  She has a history of IBS, typically  experiencing diarrhea, which she describes as 'just like water.' It has been a week since she had a normal bowel movement. She started taking Miralax , which has provided some relief, resulting in slightly formed stools. No nausea or vomiting, but occasional nausea is present. She has not been passing gas but has experienced burping.  Her past medical history includes a screening colonoscopy in August 2024, which showed polyps,  hemorrhoids, and diverticulosis of the sigmoid colon. An EGD in 2010 revealed a hiatal hernia and inflammation in the stomach and small intestines, with negative biopsies. She has had two C-sections and her gallbladder has been removed. She retains her appendix.  She underwent an abdominal x-ray last week at [facility]. She reports chills but denies fever, weight loss, or trouble swallowing. Occasional heartburn occurs, particularly in the mornings. She is not on any medications for heartburn or reflux and typically uses Tylenol  for pain. She consumes alcohol infrequently, about once every six months.  She is currently taking Miralax , one cap full, for constipation.     She  reports that she has never smoked. She has never used smokeless tobacco. She reports current alcohol use. She reports that she does not use drugs.  RELEVANT GI HISTORY, IMAGING AND LABS: Results   RADIOLOGY Abdominal X-ray: No significant findings (07/05/2024)  DIAGNOSTIC Colonoscopy: Polyps, hemorrhoids, diverticulosis of the sigmoid colon (August 2024) EGD: Hiatal hernia, gastritis, enteritis, negative biopsies (2010)      CBC    Component Value Date/Time   WBC 12.4 (H) 12/03/2016 1713   RBC 4.85 12/03/2016 1713   HGB 14.1 12/03/2016 1713   HCT 42.6 12/03/2016 1713   PLT 319 12/03/2016 1713   MCV 87.8 12/03/2016 1713   MCH 29.1 12/03/2016 1713   MCHC 33.1 12/03/2016 1713   RDW 14.1 12/03/2016 1713   LYMPHSABS 4.1 (H) 12/03/2016 1713   MONOABS 1.1 (H) 12/03/2016 1713   EOSABS 0.3 12/03/2016 1713   BASOSABS 0.1 12/03/2016 1713   No results for input(s): HGB in the last 8760 hours.  CMP     Component Value Date/Time   NA 138 12/03/2016 1713   K 3.7 12/03/2016 1713   CL 106 12/03/2016 1713   CO2 24 12/03/2016 1713   GLUCOSE 125 (H) 12/03/2016 1713   BUN 27 (H) 12/03/2016 1713   CREATININE 1.08 (H) 12/03/2016 1713   CALCIUM  9.1 12/03/2016 1713   PROT 6.8 06/09/2021 1102   ALBUMIN 4.2  06/09/2021 1102   AST 27 06/09/2021 1102   ALT 20 06/09/2021 1102   ALKPHOS 47 06/09/2021 1102   BILITOT 0.4 06/09/2021 1102   GFRNONAA 55 (L) 12/03/2016 1713   GFRAA >60 12/03/2016 1713      Latest Ref Rng & Units 06/09/2021   11:02 AM 05/08/2013    5:00 PM 02/24/2009   11:12 AM  Hepatic Function  Total Protein 6.0 - 8.5 g/dL 6.8  7.9  7.6   Albumin 3.8 - 4.8 g/dL 4.2  4.3  4.2   AST 0 - 40 IU/L 27  37  35   ALT 0 - 32 IU/L 20  50  39   Alk Phosphatase 44 - 121 IU/L 47  54  44   Total Bilirubin 0.0 - 1.2 mg/dL 0.4  0.6  0.5   Bilirubin, Direct 0.00 - 0.40 mg/dL 9.85   0.1       Current Medications:   Current Outpatient Medications (Endocrine & Metabolic):    levothyroxine (SYNTHROID, LEVOTHROID) 112 MCG tablet, Take  112 mcg by mouth daily before breakfast.  Current Outpatient Medications (Cardiovascular):    fenofibrate (TRICOR) 145 MG tablet, Take 145 mg by mouth every other day.   rosuvastatin  (CRESTOR ) 20 MG tablet, TAKE 1 TABLET BY MOUTH DAILY     Current Outpatient Medications (Other):    amoxicillin -clavulanate (AUGMENTIN ) 875-125 MG tablet, Take 1 tablet by mouth 2 (two) times daily.   Cholecalciferol (VITAMIN D3) 2000 units TABS, Take 2,000 Units by mouth every other day.   dicyclomine  (BENTYL ) 10 MG capsule, Take 10 mg by mouth 3 (three) times daily as needed.   escitalopram  (LEXAPRO ) 10 MG tablet, 1 tablet   estradiol (ESTRACE) 0.1 MG/GM vaginal cream, Insert 1 g twice a week by vaginal route at bedtime.   famotidine  (PEPCID ) 40 MG tablet, Take 1 tablet (40 mg total) by mouth at bedtime.   primidone  (MYSOLINE ) 50 MG tablet, Take 50 mg by mouth at bedtime.  Medical History:  Past Medical History:  Diagnosis Date   Anxiety    CAD (coronary artery disease)    minimal plaque by cath 4/07: mLAD 30%, EF 60%   Depression    Essential tremor    GERD (gastroesophageal reflux disease)    Hepatic steatosis 03/08/2012   Hiatal hernia    History of IBS    History of  kidney stones    Hyperglycemia    Borderline   Hyperlipidemia    Hypothyroidism    Right thyroid  nodule     s/p thyroid  biopsy; report states follicular epitehlial cells, histiocytes, colloid, and lymphocytes (thyroiditis). Suggest a non-neoplastic process such as a hyperplastic nodule/ non-neoplastic goiter in a background of thyroiditis. Thyroid  biopsy was done under a thyroid  ultrasound at [facility].   SVT (supraventricular tachycardia) (HCC)    Hx of, s/p EP study with catheter ablation of AV nodal; re-entry tachycardia 01/10/06, s/p adenosine myoview, 10/06 showing an EF of 70% with no ischemia   Wolff-Parkinson-White (WPW) pattern    Allergies:  Allergies  Allergen Reactions   Codeine Nausea And Vomiting and Swelling    Other Reaction(s): severe nausea     Surgical History:  She  has a past surgical history that includes Biopsy thyroid ; Cesarean section; Laparoscopic cholecystectomy w/ cholangiography; Total thyroidectomy (11/08/2007); Tubal ligation; Extracorporeal shock wave lithotripsy (Left, 12/13/2016); Cardiac electrophysiology mapping and ablation (2007); and Ablation. Family History:  Her family history includes Bladder Cancer in her father; Coronary artery disease (age of onset: 23) in her mother; Dementia in her father and sister; Heart attack (age of onset: 56) in her father; Stroke (age of onset: 67) in her mother.  REVIEW OF SYSTEMS  : All other systems reviewed and negative except where noted in the History of Present Illness.  PHYSICAL EXAM: BP 120/84   Pulse 75   Ht 5' 6 (1.676 m)   Wt 170 lb (77.1 kg)   BMI 27.44 kg/m  Physical Exam   GENERAL APPEARANCE: Well nourished, in no apparent distress. HEENT: No cervical lymphadenopathy, unremarkable thyroid , sclerae anicteric, conjunctiva pink. RESPIRATORY: Respiratory effort normal, breath sounds equal bilaterally without rales, rhonchi, or wheezing. CARDIO: Regular rate and rhythm with no murmurs,  rubs, or gallops, peripheral pulses intact. ABDOMEN: Soft, non-distended, active bowel sounds in all four quadrants, tenderness in right lower quadrant, mild tenderness in left lower quadrant, no rebound, no mass appreciated. RECTAL: Declines. MUSCULOSKELETAL: Full range of motion, normal gait, without edema. SKIN: Dry, intact without rashes or lesions. No jaundice. NEURO: Alert, oriented, no focal deficits. PSYCH:  Cooperative, normal mood and affect.      Alan JONELLE Coombs, PA-C 10:29 AM

## 2024-07-12 ENCOUNTER — Ambulatory Visit: Payer: Self-pay | Admitting: Physician Assistant

## 2024-07-12 ENCOUNTER — Other Ambulatory Visit (INDEPENDENT_AMBULATORY_CARE_PROVIDER_SITE_OTHER)

## 2024-07-12 ENCOUNTER — Inpatient Hospital Stay (HOSPITAL_COMMUNITY)
Admission: EM | Admit: 2024-07-12 | Discharge: 2024-07-17 | DRG: 373 | Disposition: A | Discharge status: Home / Self Care | Attending: Surgery | Admitting: Surgery

## 2024-07-12 ENCOUNTER — Other Ambulatory Visit: Payer: Self-pay

## 2024-07-12 ENCOUNTER — Encounter (HOSPITAL_COMMUNITY): Payer: Self-pay

## 2024-07-12 ENCOUNTER — Ambulatory Visit (HOSPITAL_BASED_OUTPATIENT_CLINIC_OR_DEPARTMENT_OTHER)
Admission: RE | Admit: 2024-07-12 | Discharge: 2024-07-12 | Disposition: A | Discharge status: Home / Self Care | Source: Ambulatory Visit | Attending: Physician Assistant | Admitting: Physician Assistant

## 2024-07-12 ENCOUNTER — Ambulatory Visit: Admitting: Physician Assistant

## 2024-07-12 ENCOUNTER — Telehealth: Payer: Self-pay

## 2024-07-12 ENCOUNTER — Encounter: Payer: Self-pay | Admitting: Physician Assistant

## 2024-07-12 VITALS — BP 120/84 | HR 75 | Ht 66.0 in | Wt 170.0 lb

## 2024-07-12 DIAGNOSIS — F32A Depression, unspecified: Secondary | ICD-10-CM | POA: Diagnosis not present

## 2024-07-12 DIAGNOSIS — Z823 Family history of stroke: Secondary | ICD-10-CM

## 2024-07-12 DIAGNOSIS — K219 Gastro-esophageal reflux disease without esophagitis: Secondary | ICD-10-CM | POA: Diagnosis not present

## 2024-07-12 DIAGNOSIS — Z885 Allergy status to narcotic agent status: Secondary | ICD-10-CM | POA: Diagnosis not present

## 2024-07-12 DIAGNOSIS — I2510 Atherosclerotic heart disease of native coronary artery without angina pectoris: Secondary | ICD-10-CM | POA: Diagnosis present

## 2024-07-12 DIAGNOSIS — K760 Fatty (change of) liver, not elsewhere classified: Secondary | ICD-10-CM | POA: Diagnosis present

## 2024-07-12 DIAGNOSIS — K5730 Diverticulosis of large intestine without perforation or abscess without bleeding: Secondary | ICD-10-CM

## 2024-07-12 DIAGNOSIS — Z8249 Family history of ischemic heart disease and other diseases of the circulatory system: Secondary | ICD-10-CM | POA: Diagnosis not present

## 2024-07-12 DIAGNOSIS — R1031 Right lower quadrant pain: Secondary | ICD-10-CM

## 2024-07-12 DIAGNOSIS — K3532 Acute appendicitis with perforation and localized peritonitis, without abscess: Secondary | ICD-10-CM | POA: Diagnosis present

## 2024-07-12 DIAGNOSIS — Z8052 Family history of malignant neoplasm of bladder: Secondary | ICD-10-CM | POA: Diagnosis not present

## 2024-07-12 DIAGNOSIS — E890 Postprocedural hypothyroidism: Secondary | ICD-10-CM | POA: Diagnosis present

## 2024-07-12 DIAGNOSIS — K35211 Acute appendicitis with generalized peritonitis, with perforation and abscess: Principal | ICD-10-CM | POA: Diagnosis present

## 2024-07-12 DIAGNOSIS — R198 Other specified symptoms and signs involving the digestive system and abdomen: Secondary | ICD-10-CM

## 2024-07-12 DIAGNOSIS — K589 Irritable bowel syndrome without diarrhea: Secondary | ICD-10-CM | POA: Diagnosis present

## 2024-07-12 DIAGNOSIS — R140 Abdominal distension (gaseous): Secondary | ICD-10-CM | POA: Diagnosis not present

## 2024-07-12 DIAGNOSIS — E785 Hyperlipidemia, unspecified: Secondary | ICD-10-CM | POA: Diagnosis not present

## 2024-07-12 DIAGNOSIS — K581 Irritable bowel syndrome with constipation: Secondary | ICD-10-CM

## 2024-07-12 DIAGNOSIS — R1032 Left lower quadrant pain: Secondary | ICD-10-CM | POA: Diagnosis not present

## 2024-07-12 DIAGNOSIS — Z79890 Hormone replacement therapy (postmenopausal): Secondary | ICD-10-CM

## 2024-07-12 DIAGNOSIS — Z860101 Personal history of adenomatous and serrated colon polyps: Secondary | ICD-10-CM | POA: Diagnosis not present

## 2024-07-12 DIAGNOSIS — Z9049 Acquired absence of other specified parts of digestive tract: Secondary | ICD-10-CM | POA: Diagnosis not present

## 2024-07-12 DIAGNOSIS — F419 Anxiety disorder, unspecified: Secondary | ICD-10-CM | POA: Diagnosis present

## 2024-07-12 DIAGNOSIS — K838 Other specified diseases of biliary tract: Secondary | ICD-10-CM | POA: Diagnosis not present

## 2024-07-12 DIAGNOSIS — D72828 Other elevated white blood cell count: Secondary | ICD-10-CM | POA: Diagnosis not present

## 2024-07-12 DIAGNOSIS — R599 Enlarged lymph nodes, unspecified: Secondary | ICD-10-CM | POA: Diagnosis not present

## 2024-07-12 DIAGNOSIS — R1013 Epigastric pain: Secondary | ICD-10-CM | POA: Diagnosis not present

## 2024-07-12 DIAGNOSIS — K641 Second degree hemorrhoids: Secondary | ICD-10-CM

## 2024-07-12 DIAGNOSIS — R110 Nausea: Secondary | ICD-10-CM

## 2024-07-12 LAB — CBC WITH DIFFERENTIAL/PLATELET
Basophils Absolute: 0.2 K/uL — ABNORMAL HIGH (ref 0.0–0.1)
Basophils Relative: 1 % (ref 0.0–3.0)
Eosinophils Absolute: 0.3 K/uL (ref 0.0–0.7)
Eosinophils Relative: 2 % (ref 0.0–5.0)
HCT: 41.8 % (ref 36.0–46.0)
Hemoglobin: 14.2 g/dL (ref 12.0–15.0)
Lymphocytes Relative: 19.5 % (ref 12.0–46.0)
Lymphs Abs: 2.9 K/uL (ref 0.7–4.0)
MCHC: 34 g/dL (ref 30.0–36.0)
MCV: 89.4 fl (ref 78.0–100.0)
Monocytes Absolute: 1.4 K/uL — ABNORMAL HIGH (ref 0.1–1.0)
Monocytes Relative: 9.4 % (ref 3.0–12.0)
Neutro Abs: 10.2 K/uL — ABNORMAL HIGH (ref 1.4–7.7)
Neutrophils Relative %: 68.1 % (ref 43.0–77.0)
Platelets: 376 K/uL (ref 150.0–400.0)
RBC: 4.67 Mil/uL (ref 3.87–5.11)
RDW: 14.4 % (ref 11.5–15.5)
WBC: 15 K/uL — ABNORMAL HIGH (ref 4.0–10.5)

## 2024-07-12 LAB — COMPREHENSIVE METABOLIC PANEL WITH GFR
ALT: 20 U/L (ref 0–35)
AST: 25 U/L (ref 0–37)
Albumin: 4.1 g/dL (ref 3.5–5.2)
Alkaline Phosphatase: 40 U/L (ref 39–117)
BUN: 15 mg/dL (ref 6–23)
CO2: 24 meq/L (ref 19–32)
Calcium: 9.6 mg/dL (ref 8.4–10.5)
Chloride: 105 meq/L (ref 96–112)
Creatinine, Ser: 1.01 mg/dL (ref 0.40–1.20)
GFR: 58.33 mL/min — ABNORMAL LOW (ref >60.00)
Glucose, Bld: 135 mg/dL — ABNORMAL HIGH (ref 70–99)
Potassium: 4 meq/L (ref 3.5–5.1)
Sodium: 137 meq/L (ref 135–145)
Total Bilirubin: 0.6 mg/dL (ref 0.2–1.2)
Total Protein: 7.6 g/dL (ref 6.0–8.3)

## 2024-07-12 LAB — CBC
HCT: 42.4 % (ref 36.0–46.0)
Hemoglobin: 13.7 g/dL (ref 12.0–15.0)
MCH: 29.7 pg (ref 26.0–34.0)
MCHC: 32.3 g/dL (ref 30.0–36.0)
MCV: 92 fL (ref 80.0–100.0)
Platelets: 381 K/uL (ref 150–400)
RBC: 4.61 MIL/uL (ref 3.87–5.11)
RDW: 14 % (ref 11.5–15.5)
WBC: 16.3 K/uL — ABNORMAL HIGH (ref 4.0–10.5)
nRBC: 0 % (ref 0.0–0.2)

## 2024-07-12 LAB — SEDIMENTATION RATE: Sed Rate: 41 mm/h — ABNORMAL HIGH (ref 0–30)

## 2024-07-12 LAB — TSH: TSH: 0.7 u[IU]/mL (ref 0.35–5.50)

## 2024-07-12 LAB — I-STAT CG4 LACTIC ACID, ED: Lactic Acid, Venous: 0.9 mmol/L (ref 0.5–1.9)

## 2024-07-12 LAB — CREATININE, SERUM
Creatinine, Ser: 0.91 mg/dL (ref 0.44–1.00)
GFR, Estimated: >60 mL/min (ref >60)

## 2024-07-12 MED ORDER — ONDANSETRON 4 MG PO TBDP: 4.0000 mg | ORAL_TABLET | Freq: Four times a day (QID) | ORAL | Status: DC | PRN

## 2024-07-12 MED ORDER — OXYCODONE HCL 5 MG PO TABS
5.0000 mg | ORAL_TABLET | ORAL | Status: DC | PRN
  Administered 2024-07-14 – 2024-07-15 (×3): 5 mg via ORAL
  Filled 2024-07-12 (×5): qty 1

## 2024-07-12 MED ORDER — IOHEXOL 300 MG/ML  SOLN
80.0000 mL | Freq: Once | INTRAMUSCULAR | Status: AC | PRN
  Administered 2024-07-12: 80 mL via INTRAVENOUS

## 2024-07-12 MED ORDER — DIPHENHYDRAMINE HCL 12.5 MG/5ML PO ELIX: 12.5000 mg | ORAL_SOLUTION | Freq: Four times a day (QID) | ORAL | Status: DC | PRN

## 2024-07-12 MED ORDER — PIPERACILLIN-TAZOBACTAM 3.375 G IVPB 30 MIN
3.3750 g | Freq: Once | INTRAVENOUS | Status: AC
  Administered 2024-07-12: 3.375 g via INTRAVENOUS
  Filled 2024-07-12: qty 50

## 2024-07-12 MED ORDER — METOPROLOL TARTRATE 5 MG/5ML IV SOLN: 5.0000 mg | Freq: Four times a day (QID) | INTRAVENOUS | Status: DC | PRN

## 2024-07-12 MED ORDER — DIPHENHYDRAMINE HCL 50 MG/ML IJ SOLN: 12.5000 mg | Freq: Four times a day (QID) | INTRAMUSCULAR | Status: DC | PRN

## 2024-07-12 MED ORDER — ACETAMINOPHEN 325 MG PO TABS
650.0000 mg | ORAL_TABLET | Freq: Four times a day (QID) | ORAL | Status: DC | PRN
  Administered 2024-07-13 – 2024-07-16 (×6): 650 mg via ORAL
  Filled 2024-07-12 (×6): qty 2

## 2024-07-12 MED ORDER — PIPERACILLIN-TAZOBACTAM 3.375 G IVPB
3.3750 g | Freq: Three times a day (TID) | INTRAVENOUS | Status: DC
  Administered 2024-07-13 – 2024-07-17 (×13): 3.375 g via INTRAVENOUS
  Filled 2024-07-12 (×13): qty 50

## 2024-07-12 MED ORDER — POLYETHYLENE GLYCOL 3350 17 G PO PACK
17.0000 g | PACK | Freq: Every day | ORAL | Status: DC | PRN
  Administered 2024-07-14 – 2024-07-16 (×3): 17 g via ORAL
  Filled 2024-07-12 (×3): qty 1

## 2024-07-12 MED ORDER — MORPHINE SULFATE (PF) 2 MG/ML IV SOLN
2.0000 mg | INTRAVENOUS | Status: DC | PRN
  Administered 2024-07-13 (×3): 2 mg via INTRAVENOUS
  Filled 2024-07-12 (×3): qty 1

## 2024-07-12 MED ORDER — HYDROMORPHONE HCL 1 MG/ML IJ SOLN
1.0000 mg | Freq: Once | INTRAMUSCULAR | Status: AC
Start: 2024-07-12 — End: 2024-07-12
  Administered 2024-07-12: 1 mg via INTRAVENOUS
  Filled 2024-07-12: qty 1

## 2024-07-12 MED ORDER — SODIUM CHLORIDE 0.9 % IV BOLUS
1000.0000 mL | Freq: Once | INTRAVENOUS | Status: AC
  Administered 2024-07-12: 1000 mL via INTRAVENOUS

## 2024-07-12 MED ORDER — DEXTROSE-SODIUM CHLORIDE 5-0.45 % IV SOLN: INTRAVENOUS | Status: DC

## 2024-07-12 MED ORDER — ACETAMINOPHEN 650 MG RE SUPP: 650.0000 mg | Freq: Four times a day (QID) | RECTAL | Status: DC | PRN

## 2024-07-12 MED ORDER — ENOXAPARIN SODIUM 40 MG/0.4ML IJ SOSY
40.0000 mg | PREFILLED_SYRINGE | INTRAMUSCULAR | Status: DC
  Administered 2024-07-13 – 2024-07-16 (×4): 40 mg via SUBCUTANEOUS
  Filled 2024-07-12 (×4): qty 0.4

## 2024-07-12 MED ORDER — FAMOTIDINE 40 MG PO TABS: 40.0000 mg | ORAL_TABLET | Freq: Every day | ORAL | 0 refills | Status: DC

## 2024-07-12 MED ORDER — SODIUM CHLORIDE 0.9 % IV SOLN
12.5000 mg | Freq: Four times a day (QID) | INTRAVENOUS | Status: DC | PRN
  Administered 2024-07-12 – 2024-07-16 (×2): 12.5 mg via INTRAVENOUS
  Filled 2024-07-12 (×2): qty 12.5

## 2024-07-12 MED ORDER — ONDANSETRON HCL 4 MG/2ML IJ SOLN
4.0000 mg | Freq: Four times a day (QID) | INTRAMUSCULAR | Status: DC | PRN
  Administered 2024-07-12 – 2024-07-16 (×4): 4 mg via INTRAVENOUS
  Filled 2024-07-12 (×5): qty 2

## 2024-07-12 MED ORDER — AMOXICILLIN-POT CLAVULANATE 875-125 MG PO TABS: 1.0000 | ORAL_TABLET | Freq: Two times a day (BID) | ORAL | 0 refills | Status: DC

## 2024-07-12 NOTE — Telephone Encounter (Signed)
 Received secure chat from Alan Coombs, [place] & radiology tech for CT report listed below and to inform patient to go to the ER for evaluation. Pt has been made aware & husband is going to drive her.   IMPRESSION: 1. Findings consistent with severe appendicitis with 38 x 18 mm fluid collection concerning for abscess and possible appendiceal rupture. These results will be called to the ordering clinician or representative by the Radiologist Assistant, and communication documented in the PACS or zVision Dashboard. 2. Sigmoid diverticulosis without inflammation.

## 2024-07-12 NOTE — ED Triage Notes (Signed)
 Pt arrived via POV,  c/o abd pain, diarrhea and nausea since last night Friday. Was seen by GI this morning, CT showed appendicitis.    Last ate at 0730 07/12/2024

## 2024-07-12 NOTE — Patient Instructions (Signed)
 You have been scheduled for a STAT CT scan of the abdomen and pelvis at [facility], Radiology. The address can be found later in this AVS under upcoming appointment. You are scheduled TODAY, 9-11 at 2:30 pm. You should arrive at 12:15pm today for registration and to drink contrast If you have any questions regarding your exam or if you need to reschedule, you may call [facility] Radiology Scheduling at [phone] between the hours of 8:00 am and 5:00 pm, Monday-Friday.     Your provider has requested that you go to the basement level for lab work before leaving today. Press B on the elevator. The lab is located at the first door on the left as you exit the elevator.  We have sent the following medications to your pharmacy for you to pick up at your convenience: Augmentin  Pepcid   First do a trial off milk/lactose products if you use them.  Add fiber like benefiber or citracel once a day Increase activity Can do trial of IBGard which is over the counter for AB pain- Take 1-2 capsules once a day for maintence or twice a day during a flare    FODMAP stands for fermentable oligo-, di-, mono-saccharides and polyols (1). These are the scientific terms used to classify groups of carbs that are difficult for our body to digest and that are notorious for triggering digestive symptoms like bloating, gas, loose stools and stomach pain.   You can try low FODMAP diet  - start with eliminating just one column at a time that you feel may be a trigger for you. - the table at the very bottom contains foods that are low in FODMAPs   Sometimes trying to eliminate the FODMAP's from your diet is difficult or tricky, if you are stuggling with trying to do the elimination diet you can try an enzyme.  There is a food enzymes that you sprinkle in or on your food that helps break down the FODMAP. You can read more about the enzyme by going to this site: https://fodzyme.com/   Will give  Augmentin   Set up CT AB and pelvis with contrast   During an Acute Flare-Up (Clear Liquid to Low-Fiber Diet) The goal is to reduce irritation and let your colon rest.  Day 1-3: Clear Liquid Diet Water  Broth (chicken, beef, or vegetable)  Clear juices (apple, white grape - avoid citrus)  Ice pops without pulp or seeds  Gelatin (no fruit or seeds)  Tea or coffee (no cream or dairy)  After Symptoms Improve: Low-Fiber Diet Gradually transition to low-fiber foods for easier digestion.  Sample Foods White rice, pasta, or plain white bread  Cooked or canned vegetables without skins or seeds (e.g., carrots, green beans, potatoes)  Eggs, fish, or poultry  Low-fiber cereals (like cornflakes)  Dairy (if tolerated)  Ripe bananas, melon, or canned fruit without seeds  Long-Term Maintenance: High-Fiber Diet (Once Fully Recovered) This helps prevent future flare-ups by keeping the bowel movements soft and regular.  High-Fiber Foods Fruits: Apples (peeled), pears, berries, prunes  Vegetables: Broccoli, spinach, zucchini, peas  Whole grains: Oatmeal, brown rice, quinoa, whole wheat bread  Legumes: Lentils, chickpeas, black beans (start slowly to avoid gas)  Nuts & seeds: Only if tolerated (research no longer restricts them, but if you feel they cause a flare do not eat them)  Go to the ER if unable to pass gas, severe AB pain, unable to hold down food, any shortness of breath of chest pain.  Diverticulitis Diverticulitis is inflammation or infection of small pouches in your colon that form when you have a condition called diverticulosis. The pouches in your colon are called diverticula. Your colon, or large intestine, is where water is absorbed and stool is formed. Complications of diverticulitis can include: Bleeding. Severe infection. Severe pain. Perforation of your colon. Obstruction of your colon.  What are the causes? Diverticulitis is caused by  bacteria. Diverticulitis happens when stool becomes trapped in diverticula. This allows bacteria to grow in the diverticula, which can lead to inflammation and infection. What increases the risk? People with diverticulosis are at risk for diverticulitis. Eating a diet that does not include enough fiber from fruits and vegetables may make diverticulitis more likely to develop. What are the signs or symptoms? Symptoms of diverticulitis may include: Abdominal pain and tenderness. The pain is normally located on the left side of the abdomen, but may occur in other areas. Fever and chills. Bloating. Cramping. Nausea. Vomiting. Constipation. Diarrhea. Blood in your stool.  How is this diagnosed? Your health care provider will ask you about your medical history and do a physical exam. You may need to have tests done because many medical conditions can cause the same symptoms as diverticulitis. Tests may include: Blood tests. Urine tests. Imaging tests of the abdomen, including X-rays and CT scans.  When your condition is under control, your health care provider may recommend that you have a colonoscopy. A colonoscopy can show how severe your diverticula are and whether something else is causing your symptoms. How is this treated? Most cases of diverticulitis are mild and can be treated at home. Treatment may include: Taking over-the-counter pain medicines. Following a clear liquid diet. Taking antibiotic medicines by mouth for 7-10 days.  More severe cases may be treated at a hospital. Treatment may include: Not eating or drinking. Taking prescription pain medicine. Receiving antibiotic medicines through an IV tube. Receiving fluids and nutrition through an IV tube. Surgery.  Follow these instructions at home: Follow your health care provider's instructions carefully. Follow a full liquid diet or other diet as directed by your health care provider. After your symptoms improve, your  health care provider may tell you to change your diet. He or she may recommend you eat a high-fiber diet. Fruits and vegetables are good sources of fiber. Fiber makes it easier to pass stool. Take fiber supplements or probiotics as directed by your health care provider. Only take medicines as directed by your health care provider. Keep all your follow-up appointments. Contact a health care provider if: Your pain does not improve. You have a hard time eating food. Your bowel movements do not return to normal. Get help right away if: Your pain becomes worse. Your symptoms do not get better. Your symptoms suddenly get worse. You have a fever. You have repeated vomiting. You have bloody or black, tarry stools. This information is not intended to replace advice given to you by your health care provider. Make sure you discuss any questions you have with your health care provider. Document Released: 07/28/2005 Document Revised: 03/25/2016 Document Reviewed: 09/12/2013 Elsevier Interactive Patient Education  2017 [employer].   You have been scheduled for a follow up appointment on Wednesday, 10-8 at 9:20am. Please arrive 10 minutes early for registration. If you need to reschedule or cancel this appointment please call [phone] as soon as possible. Thank you.  Thank you for entrusting me with your care and for choosing [facility], Alan Coombs, P.A.-C  _______________________________________________________  If your blood pressure at your visit was 140/90 or greater, please contact your primary care physician to follow up on this.  _______________________________________________________  If you are age 32 or older, your body mass index should be between 23-30. Your Body mass index is 27.44 kg/m. If this is out of the aforementioned range listed, please consider follow up with your Primary Care Provider.  If you are age 21 or younger, your body mass index should be  between 19-25. Your Body mass index is 27.44 kg/m. If this is out of the aformentioned range listed, please consider follow up with your Primary Care Provider.   ________________________________________________________  The [facility] providers would like to encourage you to use MYCHART to communicate with providers for non-urgent requests or questions.  Due to long hold times on the telephone, sending your provider a message by [facility] may be a faster and more efficient way to get a response.  Please allow 48 business hours for a response.  Please remember that this is for non-urgent requests.  _______________________________________________________  [facility] is using a team-based approach to care.  Your team is made up of your doctor and two to three APPS. Our APPS (Nurse Practitioners and Physician Assistants) work with your physician to ensure care continuity for you. They are fully qualified to address your health concerns and develop a treatment plan. They communicate directly with your gastroenterologist to care for you. Seeing the Advanced Practice Practitioners on your physician's team can help you by facilitating care more promptly, often allowing for earlier appointments, access to diagnostic testing, procedures, and other specialty referrals.

## 2024-07-12 NOTE — Progress Notes (Signed)
 Office called with results by this Technologist.  Per  Alan Coombs with the office they will reach out to the patient and advised them to go to the ED.

## 2024-07-12 NOTE — ED Provider Notes (Signed)
 [place] EMERGENCY DEPARTMENT AT [facility] Provider Note   CSN: [id number] Arrival date & time: 07/12/24  1608     Patient presents with: Abdominal Pain   [name] is a 68 y.o. female with a past medical history significant for CAD, history of IBS, depression, GERD, essential tremor, hypothyroidism, and history of SVT who presents to the ED due to abdominal pain.  Patient had a CT abdomen pelvis done today which showed an appendicitis with abscess and possible rupture.  Patient admits to abdominal pain for the past 6 weeks.  Also endorses some nonbloody loose stool.  No fever however, admits to chills.  Denies vomiting. Last ate at 730AM.  Previous acute cholecystitis and 2 C-sections.  History obtained from patient and past medical records. No interpreter used during encounter.      Prior to Admission medications   Medication Sig Start Date End Date Taking? Authorizing Provider  amoxicillin -clavulanate (AUGMENTIN ) 875-125 MG tablet Take 1 tablet by mouth 2 (two) times daily. 07/12/24   Craig Alan SAUNDERS, PA-C  Cholecalciferol (VITAMIN D3) 2000 units TABS Take 2,000 Units by mouth every other day.    [provider]  dicyclomine  (BENTYL ) 10 MG capsule Take 10 mg by mouth 3 (three) times daily as needed.    [provider]  escitalopram  (LEXAPRO ) 10 MG tablet 1 tablet    [provider]  estradiol (ESTRACE) 0.1 MG/GM vaginal cream Insert 1 g twice a week by vaginal route at bedtime. 11/10/22   [provider]  famotidine  (PEPCID ) 40 MG tablet Take 1 tablet (40 mg total) by mouth at bedtime. 07/12/24   Craig Alan SAUNDERS, PA-C  fenofibrate (TRICOR) 145 MG tablet Take 145 mg by mouth every other day.    [provider]  levothyroxine (SYNTHROID, LEVOTHROID) 112 MCG tablet Take 112 mcg by mouth daily before breakfast.    [provider]  primidone  (MYSOLINE ) 50 MG tablet Take 50 mg by mouth at bedtime. 08/24/23   [provider]  rosuvastatin  (CRESTOR ) 20 MG tablet TAKE 1 TABLET BY MOUTH DAILY 05/07/24   Lavona Agent, MD    Allergies: Codeine    Review of Systems  Constitutional:  Negative for fever.  Respiratory:  Negative for shortness of breath.   Cardiovascular:  Negative for chest pain.  Gastrointestinal:  Positive for abdominal pain and diarrhea.    Updated Vital Signs BP (!) 143/74 (BP Location: Left Arm)   Pulse 72   Temp 98.9 F (37.2 C) (Oral)   Resp 20   SpO2 100%   Physical Exam Vitals and nursing note reviewed.  Constitutional:      General: She is not in acute distress.    Appearance: She is not ill-appearing.  HENT:     Head: Normocephalic.  Eyes:     Pupils: Pupils are equal, round, and reactive to light.  Cardiovascular:     Rate and Rhythm: Normal rate and regular rhythm.     Pulses: Normal pulses.     Heart sounds: Normal heart sounds. No murmur heard.    No friction rub. No gallop.  Pulmonary:     Effort: Pulmonary effort is normal.     Breath sounds: Normal breath sounds.  Abdominal:     General: Abdomen is flat. There is no distension.     Palpations: Abdomen is soft.     Tenderness: There is abdominal tenderness. There is no guarding or rebound.  Musculoskeletal:  General: Normal range of motion.     Cervical back: Neck supple.  Skin:    General: Skin is warm and dry.  Neurological:     General: No focal deficit present.     Mental Status: She is alert.  Psychiatric:        Mood and Affect: Mood normal.        Behavior: Behavior normal.     (all labs ordered are listed, but only abnormal results are displayed) Labs Reviewed  CULTURE, BLOOD (ROUTINE X 2)  CULTURE, BLOOD (ROUTINE X 2)  HIV ANTIBODY (ROUTINE TESTING W REFLEX)  CBC  CREATININE, SERUM  I-STAT CG4 LACTIC ACID, ED    EKG: None  Radiology: CT ABDOMEN PELVIS W CONTRAST Result Date: 07/12/2024 CLINICAL DATA:  Acute left lower quadrant abdominal pain. EXAM: CT ABDOMEN  AND PELVIS WITH CONTRAST TECHNIQUE: Multidetector CT imaging of the abdomen and pelvis was performed using the standard protocol following bolus administration of intravenous contrast. RADIATION DOSE REDUCTION: This exam was performed according to the departmental dose-optimization program which includes automated exposure control, adjustment of the mA and/or kV according to patient size and/or use of iterative reconstruction technique. CONTRAST:  80mL OMNIPAQUE  IOHEXOL  300 MG/ML  SOLN COMPARISON:  August 04, 2016. FINDINGS: Lower chest: Minimal bibasilar subsegmental atelectasis is noted. Hepatobiliary: Status post cholecystectomy. No biliary dilatation. No focal hepatic abnormality is noted. Pancreas: Unremarkable. No pancreatic ductal dilatation or surrounding inflammatory changes. Spleen: Normal in size without focal abnormality. Adrenals/Urinary Tract: Adrenal glands are unremarkable. Kidneys are normal, without renal calculi, focal lesion, or hydronephrosis. Bladder is unremarkable. Stomach/Bowel: Stomach is unremarkable. There is no evidence of bowel obstruction. Sigmoid diverticulosis is noted. There does appear to be severe appendicitis with 38 x 18 mm fluid collection concerning for abscess and possible appendiceal rupture. Vascular/Lymphatic: No significant vascular findings are present. No enlarged abdominal or pelvic lymph nodes. Reproductive: Uterus and bilateral adnexa are unremarkable. Other: No ascites or hernia is noted. Musculoskeletal: No acute or significant osseous findings. IMPRESSION: 1. Findings consistent with severe appendicitis with 38 x 18 mm fluid collection concerning for abscess and possible appendiceal rupture. These results will be called to the ordering clinician or representative by the Radiologist Assistant, and communication documented in the PACS or zVision Dashboard. 2. Sigmoid diverticulosis without inflammation. Electronically Signed   By: Lynwood Landy Raddle M.D.   On:  07/12/2024 15:14     Procedures   Medications Ordered in the ED  piperacillin -tazobactam (ZOSYN ) IVPB 3.375 g (has no administration in time range)  sodium chloride  0.9 % bolus 1,000 mL (has no administration in time range)  HYDROmorphone  (DILAUDID ) injection 1 mg (has no administration in time range)  dextrose  5 % and 0.45 % NaCl infusion (has no administration in time range)  acetaminophen  (TYLENOL ) tablet 650 mg (has no administration in time range)    Or  acetaminophen  (TYLENOL ) suppository 650 mg (has no administration in time range)  oxyCODONE  (Oxy IR/ROXICODONE ) immediate release tablet 5 mg (has no administration in time range)  morphine  (PF) 2 MG/ML injection 2 mg (has no administration in time range)  ondansetron  (ZOFRAN -ODT) disintegrating tablet 4 mg (has no administration in time range)    Or  ondansetron  (ZOFRAN ) injection 4 mg (has no administration in time range)  metoprolol  tartrate (LOPRESSOR ) injection 5 mg (has no administration in time range)  enoxaparin  (LOVENOX ) injection 40 mg (has no administration in time range)  piperacillin -tazobactam (ZOSYN ) IVPB 3.375 g (has no administration in time range)  diphenhydrAMINE  (BENADRYL ) 12.5 MG/5ML elixir 12.5 mg (has no administration in time range)    Or  diphenhydrAMINE  (BENADRYL ) injection 12.5 mg (has no administration in time range)  polyethylene glycol (MIRALAX  / GLYCOLAX ) packet 17 g (has no administration in time range)    Clinical Course as of 07/12/24 1652  Thu Jul 12, 2024  1647 Lactic Acid, Venous: 0.9 [CA]    Clinical Course User Index [CA] Lorelle Aleck BROCKS, PA-C                                 Medical Decision Making Amount and/or Complexity of Data Reviewed External Data Reviewed: radiology and notes.    Details: GI note Labs: ordered. Decision-making details documented in ED Course.  Risk Prescription drug management. Decision regarding hospitalization.   This patient presents to the ED  for concern of abdominal pain, this involves an extensive number of treatment options, and is a complaint that carries with it a high risk of complications and morbidity.  The differential diagnosis includes appendicitis, IBS, bowel obstruction, etc  68 year old female presents to the ED due to abdominal pain associated with diarrhea and nausea x 6 days.  Patient had a CT abdomen performed today which demonstrated appendicitis with abscess formation and possible perforation.  No fever.  Upon arrival patient afebrile, not tachycardic or hypoxic.  Patient has tenderness throughout abdomen with guarding.  Patient had labs performed earlier today.  CBC with leukocytosis at 15.  Normal hemoglobin.  CMP with normal renal function.  No major electrolyte derangements.  Added blood cultures.  IV fluids, antibiotics, and Dilaudid  given.  Discussed with Dr. Stevie with general surgery who will admit patient.  Co morbidities that complicate the patient evaluation  IBS  Social Determinants of Health:  Has PCP  Test / Admission - Considered:  Patient will require admission for appendicitis.  General surgery to admit.      Final diagnoses:  Acute appendicitis with perforation, generalized peritonitis, and abscess, without gangrene    ED Discharge Orders     None          Lorelle Aleck BROCKS DEVONNA 07/12/24 1652    Simon Lavonia SAILOR, MD 07/12/24 2358

## 2024-07-12 NOTE — H&P (Signed)
 Reason for Consult:abdominal pain Referring Provider: [place], M.D.  [name] is an 68 y.o. female.  HPI: 68 yo female with 1 week of abdominal pain. It started as epigastric. It moved lower. She has IBS and tried treating IBS/constipative symptoms without improvement. She has had more watery bowel movements over the last 2 days. She has been more bloated and went to the doctor yesterday and got a CT scan today and then came to the ED. She has nausea.  Past Medical History:  Diagnosis Date   Anxiety    CAD (coronary artery disease)    minimal plaque by cath 4/07: mLAD 30%, EF 60%   Depression    Essential tremor    GERD (gastroesophageal reflux disease)    Hepatic steatosis 03/08/2012   Hiatal hernia    History of IBS    History of kidney stones    Hyperglycemia    Borderline   Hyperlipidemia    Hypothyroidism    Right thyroid  nodule     s/p thyroid  biopsy; report states follicular epitehlial cells, histiocytes, colloid, and lymphocytes (thyroiditis). Suggest a non-neoplastic process such as a hyperplastic nodule/ non-neoplastic goiter in a background of thyroiditis. Thyroid  biopsy was done under a thyroid  ultrasound at [facility].   SVT (supraventricular tachycardia) (HCC)    Hx of, s/p EP study with catheter ablation of AV nodal; re-entry tachycardia 01/10/06, s/p adenosine myoview, 10/06 showing an EF of 70% with no ischemia   Wolff-Parkinson-White (WPW) pattern     Past Surgical History:  Procedure Laterality Date   ABLATION     SVT   BIOPSY THYROID      CARDIAC ELECTROPHYSIOLOGY MAPPING AND ABLATION  2007   CESAREAN SECTION     x2 via lower midline   EXTRACORPOREAL SHOCK WAVE LITHOTRIPSY Left 12/13/2016   Procedure: LEFT EXTRACORPOREAL SHOCK WAVE LITHOTRIPSY (ESWL);  Surgeon: Donnice Brooks, MD;  Location: [facility] ORS;  Service: Urology;  Laterality: Left;   LAPAROSCOPIC CHOLECYSTECTOMY W/ CHOLANGIOGRAPHY     TOTAL THYROIDECTOMY  11/08/2007    TUBAL LIGATION      Family History  Problem Relation Age of Onset   Coronary artery disease Mother 44       CABG   Stroke Mother 49   Heart attack Father 81       MI x2 and had CABG   Bladder Cancer Father    Dementia Father    Dementia Sister        early onset frontal lobe   Colon cancer Neg Hx    Esophageal cancer Neg Hx    Stomach cancer Neg Hx     Social History:  reports that she has never smoked. She has never used smokeless tobacco. She reports current alcohol use. She reports that she does not use drugs.  Allergies:  Allergies  Allergen Reactions   Codeine Nausea And Vomiting and Swelling    Other Reaction(s): severe nausea    Medications: I have reviewed the patient's current medications.  Results for orders placed or performed during the hospital encounter of 07/12/24 (from the past 48 hours)  I-Stat CG4 Lactic Acid     Status: None   Collection Time: 07/12/24  4:43 PM  Result Value Ref Range   Lactic Acid, Venous 0.9 0.5 - 1.9 mmol/L    PE Blood pressure (!) 143/74, pulse 72, temperature 98.9 F (37.2 C), temperature source Oral, resp. rate 20, SpO2 100%. Constitutional: NAD; conversant; no deformities Eyes: Moist conjunctiva; no lid lag;  anicteric; PERRL Neck: Trachea midline; no thyromegaly Lungs: Normal respiratory effort; no tactile fremitus CV: RRR; no palpable thrills; no pitting edema GI: Abd soft, distended, tender to palpation right side and periumbilically; no palpable hepatosplenomegaly MSK: Normal gait; no clubbing/cyanosis Psychiatric: Appropriate affect; alert and oriented x3 Lymphatic: No palpable cervical or axillary lymphadenopathy Skin: No major subcutaneous nodules. Warm and dry   Assessment/Plan: 68 yo female with acute appendicitis with perforation and abscess. I reviewed CT scan showing a 4 x 1.7 cm abscess near the appendix. Contrast is seen in the colon so she does not appear to be obstructed. This seems to have started 1 week  ago from symptoms as well as the walled off abscess. I think the best path forward is admission for antibiotics. I do not think this would be easily drainable but I will discuss with IR. -IV zosyn  -med surg admission -pain control -clear liquid diet  I reviewed last 24 h vitals and pain scores, last 48 h intake and output, last 24 h labs and trends, and last 24 h imaging results.  This care required high  level of medical decision making.   Herlene Righter Taeja Debellis 07/12/2024, 4:49 PM

## 2024-07-13 ENCOUNTER — Inpatient Hospital Stay (HOSPITAL_COMMUNITY)

## 2024-07-13 LAB — CBC
HCT: 40.1 % (ref 36.0–46.0)
Hemoglobin: 12.8 g/dL (ref 12.0–15.0)
MCH: 30 pg (ref 26.0–34.0)
MCHC: 31.9 g/dL (ref 30.0–36.0)
MCV: 94.1 fL (ref 80.0–100.0)
Platelets: 354 K/uL (ref 150–400)
RBC: 4.26 MIL/uL (ref 3.87–5.11)
RDW: 13.9 % (ref 11.5–15.5)
WBC: 12.7 K/uL — ABNORMAL HIGH (ref 4.0–10.5)
nRBC: 0 % (ref 0.0–0.2)

## 2024-07-13 LAB — BASIC METABOLIC PANEL WITH GFR
Anion gap: 11 (ref 5–15)
BUN: 9 mg/dL (ref 8–23)
CO2: 23 mmol/L (ref 22–32)
Calcium: 8.5 mg/dL — ABNORMAL LOW (ref 8.9–10.3)
Chloride: 105 mmol/L (ref 98–111)
Creatinine, Ser: 0.87 mg/dL (ref 0.44–1.00)
GFR, Estimated: >60 mL/min (ref >60)
Glucose, Bld: 115 mg/dL — ABNORMAL HIGH (ref 70–99)
Potassium: 4 mmol/L (ref 3.5–5.1)
Sodium: 139 mmol/L (ref 135–145)

## 2024-07-13 LAB — HIV ANTIBODY (ROUTINE TESTING W REFLEX): HIV Screen 4th Generation wRfx: NONREACTIVE

## 2024-07-13 MED ORDER — PRIMIDONE 50 MG PO TABS
50.0000 mg | ORAL_TABLET | Freq: Every day | ORAL | Status: DC
  Administered 2024-07-13 – 2024-07-16 (×4): 50 mg via ORAL
  Filled 2024-07-13 (×8): qty 1

## 2024-07-13 MED ORDER — MORPHINE SULFATE (PF) 4 MG/ML IV SOLN
4.0000 mg | INTRAVENOUS | Status: DC | PRN
  Administered 2024-07-13 – 2024-07-16 (×4): 4 mg via INTRAVENOUS
  Filled 2024-07-13 (×4): qty 1

## 2024-07-13 MED ORDER — FAMOTIDINE 20 MG PO TABS
40.0000 mg | ORAL_TABLET | Freq: Every day | ORAL | Status: DC
  Administered 2024-07-13 – 2024-07-14 (×2): 40 mg via ORAL
  Administered 2024-07-15: 20 mg via ORAL
  Administered 2024-07-16: 40 mg via ORAL
  Filled 2024-07-13 (×5): qty 2

## 2024-07-13 MED ORDER — ESCITALOPRAM OXALATE 20 MG PO TABS
10.0000 mg | ORAL_TABLET | Freq: Every day | ORAL | Status: DC
  Administered 2024-07-13 – 2024-07-16 (×4): 10 mg via ORAL
  Filled 2024-07-13 (×5): qty 1

## 2024-07-13 NOTE — Progress Notes (Signed)
 Patient ID: [name], female   DOB: 30-Sep-1956, 67 y.o.   MRN: [id number] Request received from [facility] for consideration of image guided periappendiceal abscess drain placement in patient.  Latest imaging studies were reviewed by Dr. Johann.  Area in question is too small for percutaneous drainage at this time.  Recommend antibiotics and follow-up imaging in 3 to 5 days.

## 2024-07-13 NOTE — Progress Notes (Addendum)
 [facility] Progress Note     Subjective: CC:  States her abdominal pain is worse. Reports chills. Vomited last night.   AFVSS, WBC 12.7 from 16   Objective: Vital signs in last 24 hours: Temp:  [97.5 F (36.4 C)-98.9 F (37.2 C)] 97.5 F (36.4 C) (09/12 0558) Pulse Rate:  [57-72] 57 (09/12 0558) Resp:  [15-20] 17 (09/12 0558) BP: (108-143)/(62-74) 110/62 (09/12 0558) SpO2:  [97 %-100 %] 98 % (09/12 0558) Weight:  [77.2 kg] 77.2 kg (09/11 1750) Last BM Date : 07/12/24  Intake/Output from previous day: 09/11 0701 - 09/12 0700 In: 1794.2 [I.V.:1162.9; IV Piggyback:631.2] Out: 0  Intake/Output this shift: No intake/output data recorded.  PE: Gen:  Alert, NAD, non-toxic appearing, tremulous which she says is baseline  Card:  Regular rate and rhythm, pedal pulses 2+ BL Pulm:  Normal effort ORA Abd: Soft, mild to moderate abdominal distention, TTP RLQ and periumbilical region with guarding, no peritonitis, negative Rovsing's sign, no hernias or masses, previous lower midline scar from C-sections noted Skin: warm and dry, no rashes  Psych: A&Ox3   Lab Results:  Recent Labs    07/12/24 1639 07/13/24 0413  WBC 16.3* 12.7*  HGB 13.7 12.8  HCT 42.4 40.1  PLT 381 354   BMET Recent Labs    07/12/24 0933 07/12/24 1639 07/13/24 0413  NA 137  --  139  K 4.0  --  4.0  CL 105  --  105  CO2 24  --  23  GLUCOSE 135*  --  115*  BUN 15  --  9  CREATININE 1.01 0.91 0.87  CALCIUM  9.6  --  8.5*   PT/INR No results for input(s): LABPROT, INR in the last 72 hours. CMP     Component Value Date/Time   NA 139 07/13/2024 0413   K 4.0 07/13/2024 0413   CL 105 07/13/2024 0413   CO2 23 07/13/2024 0413   GLUCOSE 115 (H) 07/13/2024 0413   BUN 9 07/13/2024 0413   CREATININE 0.87 07/13/2024 0413   CALCIUM  8.5 (L) 07/13/2024 0413   PROT 7.6 07/12/2024 0933   PROT 6.8 06/09/2021 1102   ALBUMIN 4.1 07/12/2024 0933   ALBUMIN 4.2 06/09/2021 1102   AST 25  07/12/2024 0933   ALT 20 07/12/2024 0933   ALKPHOS 40 07/12/2024 0933   BILITOT 0.6 07/12/2024 0933   BILITOT 0.4 06/09/2021 1102   GFRNONAA >60 07/13/2024 0413   GFRAA >60 12/03/2016 1713   Lipase  No results found for: LIPASE     Studies/Results: CT ABDOMEN PELVIS W CONTRAST Result Date: 07/12/2024 CLINICAL DATA:  Acute left lower quadrant abdominal pain. EXAM: CT ABDOMEN AND PELVIS WITH CONTRAST TECHNIQUE: Multidetector CT imaging of the abdomen and pelvis was performed using the standard protocol following bolus administration of intravenous contrast. RADIATION DOSE REDUCTION: This exam was performed according to the departmental dose-optimization program which includes automated exposure control, adjustment of the mA and/or kV according to patient size and/or use of iterative reconstruction technique. CONTRAST:  80mL OMNIPAQUE  IOHEXOL  300 MG/ML  SOLN COMPARISON:  August 04, 2016. FINDINGS: Lower chest: Minimal bibasilar subsegmental atelectasis is noted. Hepatobiliary: Status post cholecystectomy. No biliary dilatation. No focal hepatic abnormality is noted. Pancreas: Unremarkable. No pancreatic ductal dilatation or surrounding inflammatory changes. Spleen: Normal in size without focal abnormality. Adrenals/Urinary Tract: Adrenal glands are unremarkable. Kidneys are normal, without renal calculi, focal lesion, or hydronephrosis. Bladder is unremarkable. Stomach/Bowel: Stomach is unremarkable. There is no evidence of bowel  obstruction. Sigmoid diverticulosis is noted. There does appear to be severe appendicitis with 38 x 18 mm fluid collection concerning for abscess and possible appendiceal rupture. Vascular/Lymphatic: No significant vascular findings are present. No enlarged abdominal or pelvic lymph nodes. Reproductive: Uterus and bilateral adnexa are unremarkable. Other: No ascites or hernia is noted. Musculoskeletal: No acute or significant osseous findings. IMPRESSION: 1. Findings  consistent with severe appendicitis with 38 x 18 mm fluid collection concerning for abscess and possible appendiceal rupture. These results will be called to the ordering clinician or representative by the Radiologist Assistant, and communication documented in the PACS or zVision Dashboard. 2. Sigmoid diverticulosis without inflammation. Electronically Signed   By: Lynwood Landy Raddle M.D.   On: 07/12/2024 15:14    Anti-infectives: Anti-infectives (From admission, onward)    Start     Dose/Rate Route Frequency Ordered Stop   07/13/24 0200  piperacillin -tazobactam (ZOSYN ) IVPB 3.375 g        3.375 g 12.5 mL/hr over 240 Minutes Intravenous Every 8 hours 07/12/24 1649 07/20/24 0159   07/12/24 1630  piperacillin -tazobactam (ZOSYN ) IVPB 3.375 g        3.375 g 100 mL/hr over 30 Minutes Intravenous  Once 07/12/24 1616 07/12/24 1744        Assessment/Plan  Acute appendicitis with perforation and 4 x 1.7 cm abscess  - AFVSS, WBC improving - IR unable to drain abscess, too small  - continue attempted non-operative management with IV abx and follow abdominal exam. if she develops fever, tachycardia, or peritonitis then will need surgery.  - PRN analgesics and antiemesis, if vomiting persists will check KUB to look for ileus and possible need for NGT.   FEN: NPO, IVF ID: [email or web address]   VTE: SCD's, Lovenox     LOS: 1 day   I reviewed nursing notes, ED provider notes, last 24 h vitals and pain scores, last 48 h intake and output, last 24 h labs and trends, and last 24 h imaging results.  This care required moderate level of medical decision making.   Almarie Pringle, PA-C [facility] Please see Amion for pager number during day hours 7:00am-4:30pm

## 2024-07-14 NOTE — Plan of Care (Signed)
 Pt affect polite and calm. She is tolerating her medications well. She is npo with the exception of medication. She has a pain in her RUQ that is effectively treated with her prn medication. She is able to void and ambulate with a standby assist. Will continue to monitor for progress.

## 2024-07-14 NOTE — Progress Notes (Signed)
 Subjective/Chief Complaint: Patient with episode of diarrhea.  Pain is improved today.  No further nausea or vomiting.   Objective: Vital signs in last 24 hours: Temp:  [98.4 F (36.9 C)-100.6 F (38.1 C)] 98.4 F (36.9 C) (09/13 0654) Pulse Rate:  [65-75] 65 (09/13 0654) Resp:  [18] 18 (09/12 1344) BP: (98-160)/(64-126) 104/68 (09/13 0654) SpO2:  [94 %-96 %] 94 % (09/13 0654) Last BM Date : 07/13/24  Intake/Output from previous day: 09/12 0701 - 09/13 0700 In: 1884.3 [I.V.:1740.1; IV Piggyback:144.2] Out: 0  Intake/Output this shift: No intake/output data recorded.  General appearance: alert and cooperative Resp: clear to auscultation bilaterally Cardio: Normal sinus rhythm GI: Tender right lower quadrant.  Mild distention.  No signs of peritonitis though.  Lab Results:  Recent Labs    07/12/24 1639 07/13/24 0413  WBC 16.3* 12.7*  HGB 13.7 12.8  HCT 42.4 40.1  PLT 381 354   BMET Recent Labs    07/12/24 0933 07/12/24 1639 07/13/24 0413  NA 137  --  139  K 4.0  --  4.0  CL 105  --  105  CO2 24  --  23  GLUCOSE 135*  --  115*  BUN 15  --  9  CREATININE 1.01 0.91 0.87  CALCIUM  9.6  --  8.5*   PT/INR No results for input(s): LABPROT, INR in the last 72 hours. ABG No results for input(s): PHART, HCO3 in the last 72 hours.  Invalid input(s): PCO2, PO2  Studies/Results: DG Abd Portable 1V Result Date: 07/13/2024 CLINICAL DATA:  Abdominal distention EXAM: PORTABLE ABDOMEN - 1 VIEW COMPARISON:  Abdominal x-ray 12/13/2016. CT abdomen and pelvis 07/12/2024. FINDINGS: There is oral contrast seen within the descending colon to the level the rectum. Colonic diverticula are present. There are dilated air-filled small bowel loops in the right abdomen measuring up to 4 cm. No suspicious calcifications are seen. Cholecystectomy clips are present. IMPRESSION: Dilated air-filled small bowel loops in the right abdomen measuring up to 4 cm. Findings may  represent ileus or partial small bowel obstruction. Electronically Signed   By: Greig Pique M.D.   On: 07/13/2024 15:26   CT ABDOMEN PELVIS W CONTRAST Result Date: 07/12/2024 CLINICAL DATA:  Acute left lower quadrant abdominal pain. EXAM: CT ABDOMEN AND PELVIS WITH CONTRAST TECHNIQUE: Multidetector CT imaging of the abdomen and pelvis was performed using the standard protocol following bolus administration of intravenous contrast. RADIATION DOSE REDUCTION: This exam was performed according to the departmental dose-optimization program which includes automated exposure control, adjustment of the mA and/or kV according to patient size and/or use of iterative reconstruction technique. CONTRAST:  80mL OMNIPAQUE  IOHEXOL  300 MG/ML  SOLN COMPARISON:  August 04, 2016. FINDINGS: Lower chest: Minimal bibasilar subsegmental atelectasis is noted. Hepatobiliary: Status post cholecystectomy. No biliary dilatation. No focal hepatic abnormality is noted. Pancreas: Unremarkable. No pancreatic ductal dilatation or surrounding inflammatory changes. Spleen: Normal in size without focal abnormality. Adrenals/Urinary Tract: Adrenal glands are unremarkable. Kidneys are normal, without renal calculi, focal lesion, or hydronephrosis. Bladder is unremarkable. Stomach/Bowel: Stomach is unremarkable. There is no evidence of bowel obstruction. Sigmoid diverticulosis is noted. There does appear to be severe appendicitis with 38 x 18 mm fluid collection concerning for abscess and possible appendiceal rupture. Vascular/Lymphatic: No significant vascular findings are present. No enlarged abdominal or pelvic lymph nodes. Reproductive: Uterus and bilateral adnexa are unremarkable. Other: No ascites or hernia is noted. Musculoskeletal: No acute or significant osseous findings. IMPRESSION: 1. Findings consistent with severe  appendicitis with 38 x 18 mm fluid collection concerning for abscess and possible appendiceal rupture. These results will be  called to the ordering clinician or representative by the Radiologist Assistant, and communication documented in the PACS or zVision Dashboard. 2. Sigmoid diverticulosis without inflammation. Electronically Signed   By: Lynwood Landy Raddle M.D.   On: 07/12/2024 15:14    Anti-infectives: Anti-infectives (From admission, onward)    Start     Dose/Rate Route Frequency Ordered Stop   07/13/24 0200  piperacillin -tazobactam (ZOSYN ) IVPB 3.375 g        3.375 g 12.5 mL/hr over 240 Minutes Intravenous Every 8 hours 07/12/24 1649 07/20/24 0159   07/12/24 1630  piperacillin -tazobactam (ZOSYN ) IVPB 3.375 g        3.375 g 100 mL/hr over 30 Minutes Intravenous  Once 07/12/24 1616 07/12/24 1744       Assessment/Plan:   Acute appendicitis with perforation and 4 x 1.7 cm abscess  - AFVSS, WBC improving - IR unable to drain abscess, too small  - continue attempted non-operative management with IV abx and follow abdominal exam. if she develops fever, tachycardia, or peritonitis then will need surgery.  - PRN analgesics and antiemesis,   Nausea better.  Pain better.  Advance diet.  Continue IV antibiotics.  May need reimaging in 24 to 48 hours.       FEN: NPO, IVF ID: Zosyn   VTE: SCD's, Lovenox         I reviewed nursing notes, ED provider notes, last 24 h vitals and pain scores, last 48 h intake and output, last 24 h labs and trends, and last 24 h imaging results.   This care required moderate level of medical decision making.   LOS: 2 days    Jennifer DELENA Shipper  MD  07/14/2024

## 2024-07-14 NOTE — TOC Initial Note (Signed)
 Transition of Care [facility]) - Initial/Assessment Note    Patient Details  Name: [name] MRN: [id number] Date of Birth: 1956-04-12  Transition of Care [facility]) CM/SW Contact:    Sheri ONEIDA Sharps, LCSW Phone Number: 07/14/2024, 1:22 PM  Clinical Narrative:                 Pt from home with spouse. Pt continues medical workup. IP CM following for dc needs.     Barriers to Discharge: Continued Medical Work up   Patient Goals and CMS Choice Patient states their goals for this hospitalization and ongoing recovery are:: return home   Choice offered to / list presented to : NA      Expected Discharge Plan and Services In-house Referral: NA Discharge Planning Services: NA   Living arrangements for the past 2 months: Single Family Home                 DME Arranged: N/A DME Agency: NA       HH Arranged: NA HH Agency: NA        Prior Living Arrangements/Services Living arrangements for the past 2 months: Single Family Home Lives with:: Spouse Patient language and need for interpreter reviewed:: Yes Do you feel safe going back to the place where you live?: Yes      Need for Family Participation in Patient Care: Yes (Comment) Care giver support system in place?: Yes (comment)   Criminal Activity/Legal Involvement Pertinent to Current Situation/Hospitalization: No - Comment as needed  Activities of Daily Living   ADL Screening (condition at time of admission) Independently performs ADLs?: Yes (appropriate for developmental age) Is the patient deaf or have difficulty hearing?: No Does the patient have difficulty seeing, even when wearing glasses/contacts?: No Does the patient have difficulty concentrating, remembering, or making decisions?: No  Permission Sought/Granted                  Emotional Assessment Appearance:: Appears stated age Attitude/Demeanor/Rapport: Engaged Affect (typically observed): Accepting Orientation: : Oriented to Self, Oriented to Place,  Oriented to  Time, Oriented to Situation Alcohol / Substance Use: Not Applicable Psych Involvement: No (comment)  Admission diagnosis:  Ruptured appendicitis [K35.32] Acute appendicitis with perforation, generalized peritonitis, and abscess, without gangrene [K35.211] Patient Active Problem List   Diagnosis Date Noted   Ruptured appendicitis 07/12/2024   Claudication (HCC) 05/06/2022   Elevated coronary artery calcium  score 09/12/2019   Dyslipidemia 09/12/2019   SVT (supraventricular tachycardia) (HCC)    Hyperlipidemia    GERD (gastroesophageal reflux disease)    CAD (coronary artery disease)    Essential tremor 07/06/2017   Nonspecific (abnormal) findings on radiological and other examination of gastrointestinal tract 03/09/2012   RLQ abdominal pain 03/09/2012   Palpitations 04/29/2011   OTHER ACQUIRED ABSENCE OF ORGAN 01/08/2009   THYROID  NODULE, RIGHT 01/07/2009   HYPOTHYROIDISM 01/07/2009   IRRITABLE BOWEL SYNDROME 01/07/2009   NEPHROLITHIASIS, HX OF 01/07/2009   HYPERTRIGLYCERIDEMIA 12/25/2008   HYPERLIPIDEMIA-MIXED 12/25/2008   SVT/ PSVT/ PAT 12/25/2008   CHEST PAIN-UNSPECIFIED 12/25/2008   HEARTBURN 12/25/2008   PCP:  Gerome Brunet, DO Pharmacy:   [facility] [id number] [place], [place] - [address] [place] [address] Phone: [phone] Fax: [phone]     Social Drivers of Health (SDOH) Social History: SDOH Screenings   Food Insecurity: No Food Insecurity (07/12/2024)  Housing: Low Risk  (07/12/2024)  Transportation Needs: No Transportation Needs (07/12/2024)  Utilities: Not At Risk (  07/12/2024)  Social Connections: Unknown (07/12/2024)  Tobacco Use: Low Risk  (07/12/2024)   SDOH Interventions:     Readmission Risk Interventions    07/14/2024    1:21 PM  Readmission Risk Prevention Plan  Post Dischage Appt Complete  Medication Screening Complete  Transportation Screening Complete

## 2024-07-15 LAB — CBC
HCT: 35.6 % — ABNORMAL LOW (ref 36.0–46.0)
Hemoglobin: 11.3 g/dL — ABNORMAL LOW (ref 12.0–15.0)
MCH: 30.4 pg (ref 26.0–34.0)
MCHC: 31.7 g/dL (ref 30.0–36.0)
MCV: 95.7 fL (ref 80.0–100.0)
Platelets: 312 K/uL (ref 150–400)
RBC: 3.72 MIL/uL — ABNORMAL LOW (ref 3.87–5.11)
RDW: 13.8 % (ref 11.5–15.5)
WBC: 15.7 K/uL — ABNORMAL HIGH (ref 4.0–10.5)
nRBC: 0 % (ref 0.0–0.2)

## 2024-07-15 MED ORDER — ALPRAZOLAM 0.5 MG PO TABS
0.5000 mg | ORAL_TABLET | Freq: Every evening | ORAL | Status: DC | PRN
  Administered 2024-07-15 – 2024-07-16 (×2): 0.5 mg via ORAL
  Filled 2024-07-15 (×2): qty 1

## 2024-07-15 MED ORDER — SODIUM CHLORIDE 0.9% FLUSH: 10.0000 mL | INTRAVENOUS | Status: DC | PRN

## 2024-07-15 NOTE — Progress Notes (Signed)
   Subjective/Chief Complaint: Feels about the same.  Abdominal pain is no worse but no better.   Objective: Vital signs in last 24 hours: Temp:  [98.7 F (37.1 C)] 98.7 F (37.1 C) (09/13 1411) Pulse Rate:  [63-73] 63 (09/13 1411) Resp:  [16] 16 (09/13 1411) BP: (108-109)/(59-64) 108/64 (09/13 1411) SpO2:  [96 %-97 %] 97 % (09/13 1411) Last BM Date : 07/12/24  Intake/Output from previous day: 09/13 0701 - 09/14 0700 In: 2665.1 [P.O.:600; I.V.:1956.5; IV Piggyback:108.6] Out: 0  Intake/Output this shift: No intake/output data recorded.  General appearance: alert and cooperative Resp: clear to auscultation bilaterally Cardio: Normal sinus rhythm GI: Slight distention.  Tender right lower quadrant.  This appears stable to yesterday's exam.  Lab Results:  Recent Labs    07/13/24 0413 07/15/24 0436  WBC 12.7* 15.7*  HGB 12.8 11.3*  HCT 40.1 35.6*  PLT 354 312   BMET Recent Labs    07/12/24 0933 07/12/24 1639 07/13/24 0413  NA 137  --  139  K 4.0  --  4.0  CL 105  --  105  CO2 24  --  23  GLUCOSE 135*  --  115*  BUN 15  --  9  CREATININE 1.01 0.91 0.87  CALCIUM  9.6  --  8.5*   PT/INR No results for input(s): LABPROT, INR in the last 72 hours. ABG No results for input(s): PHART, HCO3 in the last 72 hours.  Invalid input(s): PCO2, PO2  Studies/Results: DG Abd Portable 1V Result Date: 07/13/2024 CLINICAL DATA:  Abdominal distention EXAM: PORTABLE ABDOMEN - 1 VIEW COMPARISON:  Abdominal x-ray 12/13/2016. CT abdomen and pelvis 07/12/2024. FINDINGS: There is oral contrast seen within the descending colon to the level the rectum. Colonic diverticula are present. There are dilated air-filled small bowel loops in the right abdomen measuring up to 4 cm. No suspicious calcifications are seen. Cholecystectomy clips are present. IMPRESSION: Dilated air-filled small bowel loops in the right abdomen measuring up to 4 cm. Findings may represent ileus or partial  small bowel obstruction. Electronically Signed   By: Greig Pique M.D.   On: 07/13/2024 15:26    Anti-infectives: Anti-infectives (From admission, onward)    Start     Dose/Rate Route Frequency Ordered Stop   07/13/24 0200  piperacillin -tazobactam (ZOSYN ) IVPB 3.375 g        3.375 g 12.5 mL/hr over 240 Minutes Intravenous Every 8 hours 07/12/24 1649 07/20/24 0159   07/12/24 1630  piperacillin -tazobactam (ZOSYN ) IVPB 3.375 g        3.375 g 100 mL/hr over 30 Minutes Intravenous  Once 07/12/24 1616 07/12/24 1744       Assessment/Plan: Acute appendicitis with perforation and 4 x 1.7 cm abscess  - AFVSS - No change in condition.  White count is drifting upwards  Recommend CT scan Monday to see if there is a drainable fluid collection.  Discussed potential operative intervention if not but she appears to be stable for today.  Allow advancement of diet for now but make her n.p.o. after midnight. - PRN analgesics and antiemesis,   Moderate complexity   LOS: 3 days    [name] MD 07/15/2024

## 2024-07-15 NOTE — Plan of Care (Signed)
 Pt affect calm and cooperative. She ambulates and voids independently. Pain treated effectively with prn morphine . She complains that the pain is radiating to her back and flank on the right side. Will continue to monitor for changes and progress.

## 2024-07-16 ENCOUNTER — Inpatient Hospital Stay (HOSPITAL_COMMUNITY)

## 2024-07-16 LAB — CBC
HCT: 32.8 % — ABNORMAL LOW (ref 36.0–46.0)
Hemoglobin: 10.6 g/dL — ABNORMAL LOW (ref 12.0–15.0)
MCH: 30.4 pg (ref 26.0–34.0)
MCHC: 32.3 g/dL (ref 30.0–36.0)
MCV: 94 fL (ref 80.0–100.0)
Platelets: 309 K/uL (ref 150–400)
RBC: 3.49 MIL/uL — ABNORMAL LOW (ref 3.87–5.11)
RDW: 13.7 % (ref 11.5–15.5)
WBC: 10.8 K/uL — ABNORMAL HIGH (ref 4.0–10.5)
nRBC: 0 % (ref 0.0–0.2)

## 2024-07-16 MED ORDER — IOHEXOL 9 MG/ML PO SOLN
500.0000 mL | ORAL | Status: AC
  Administered 2024-07-16 (×2): 500 mL via ORAL

## 2024-07-16 MED ORDER — IOHEXOL 300 MG/ML  SOLN
100.0000 mL | Freq: Once | INTRAMUSCULAR | Status: AC | PRN
  Administered 2024-07-16: 100 mL via INTRAVENOUS

## 2024-07-16 NOTE — Progress Notes (Signed)
   Subjective/Chief Complaint: Overall feeling better. Having flatus and a semi-solid BM this AM. Endorses belching. Denies nausea/vomiting. Tolerated soft diet yesterday. Her son is at the bedside.  Objective: Vital signs in last 24 hours: Temp:  [98 F (36.7 C)-98.5 F (36.9 C)] 98.5 F (36.9 C) (09/15 0700) Pulse Rate:  [58-64] 60 (09/15 0700) Resp:  [15-20] 20 (09/15 0700) BP: (116-130)/(67-79) 130/70 (09/15 0700) SpO2:  [97 %-98 %] 97 % (09/15 0549) Last BM Date : 07/12/24  Intake/Output from previous day: 09/14 0701 - 09/15 0700 In: 1864.1 [P.O.:700; I.V.:1006.4; IV Piggyback:157.7] Out: 0  Intake/Output this shift: No intake/output data recorded.  Gen: alert, NAD CV: RRR Pulm: normal effort on room air Abd: soft, mild distention, focally tender in RLQ without guarding or rebound tenderness.  Lab Results:  Recent Labs    07/15/24 0436 07/16/24 0236  WBC 15.7* 10.8*  HGB 11.3* 10.6*  HCT 35.6* 32.8*  PLT 312 309   BMET No results for input(s): NA, K, CL, CO2, GLUCOSE, BUN, CREATININE, CALCIUM  in the last 72 hours.  PT/INR No results for input(s): LABPROT, INR in the last 72 hours. ABG No results for input(s): PHART, HCO3 in the last 72 hours.  Invalid input(s): PCO2, PO2  Studies/Results: No results found.   Anti-infectives: Anti-infectives (From admission, onward)    Start     Dose/Rate Route Frequency Ordered Stop   07/13/24 0200  piperacillin -tazobactam (ZOSYN ) IVPB 3.375 g        3.375 g 12.5 mL/hr over 240 Minutes Intravenous Every 8 hours 07/12/24 1649 07/20/24 0159   07/12/24 1630  piperacillin -tazobactam (ZOSYN ) IVPB 3.375 g        3.375 g 100 mL/hr over 30 Minutes Intravenous  Once 07/12/24 1616 07/12/24 1744       Assessment/Plan: Acute appendicitis with perforation and 4 x 1.7 cm abscess  - AFVSS, WBC downtrending (10) - CT scan today to re-evaluate abscess; clinically improving but if has undrained  abscess may benefit from IR perc drain. - PRN analgesics and antiemesis,   Moderate complexity   LOS: 4 days    Jennifer GORMAN Pringle PA-C 07/16/2024

## 2024-07-16 NOTE — Plan of Care (Signed)
 ?  Problem: Clinical Measurements: ?Goal: Ability to maintain clinical measurements within normal limits will improve ?Outcome: Progressing ?Goal: Will remain free from infection ?Outcome: Progressing ?Goal: Diagnostic test results will improve ?Outcome: Progressing ?  ?

## 2024-07-16 NOTE — Plan of Care (Signed)
   Problem: Education: Goal: Knowledge of General Education information will improve Description: Including pain rating scale, medication(s)/side effects and non-pharmacologic comfort measures Outcome: Progressing   Problem: Activity: Goal: Risk for activity intolerance will decrease Outcome: Progressing

## 2024-07-17 LAB — CULTURE, BLOOD (ROUTINE X 2)
Culture: NO GROWTH
Culture: NO GROWTH
Special Requests: ADEQUATE
Special Requests: ADEQUATE

## 2024-07-17 MED ORDER — OXYCODONE HCL 5 MG PO TABS: 5.0000 mg | ORAL_TABLET | Freq: Four times a day (QID) | ORAL | 0 refills | Status: DC | PRN

## 2024-07-17 MED ORDER — AMOXICILLIN-POT CLAVULANATE 875-125 MG PO TABS
1.0000 | ORAL_TABLET | Freq: Two times a day (BID) | ORAL | 0 refills | Status: AC
Start: 2024-07-17 — End: 2024-07-24

## 2024-07-17 MED ORDER — ONDANSETRON 4 MG PO TBDP: 4.0000 mg | ORAL_TABLET | Freq: Four times a day (QID) | ORAL | 0 refills | Status: DC | PRN

## 2024-07-17 MED ORDER — ACETAMINOPHEN 500 MG PO TABS: 1000.0000 mg | ORAL_TABLET | Freq: Four times a day (QID) | ORAL | Status: DC | PRN

## 2024-07-17 NOTE — Telephone Encounter (Signed)
 08/08/24 appt cancelled.

## 2024-07-17 NOTE — Telephone Encounter (Signed)
 PT is calling for an update. She was hospitalized for a ruptured appendix 9/11 and just discharged. She  still has her appendix. They have started oral antibiotics and told her they cannot do surgery for 4 weeks. She has an appointment 10/8/ and wants to know if she should keep this appointment. Please advise.

## 2024-07-17 NOTE — Care Management Important Message (Signed)
 Important Message  Patient Details IM Letter given. Name: [name] MRN: [id number] Date of Birth: Feb 26, 1956   Important Message Given:  Yes - Medicare IM     Melba Ates 07/17/2024, 12:38 PM

## 2024-07-17 NOTE — Discharge Summary (Signed)
 Patient ID: [name] [id number] [birth year] 68 y.o.  Admit date: 07/12/2024 Discharge date: 07/17/2024  Admitting Diagnosis: Perforated appendicitis with fluid collection  Discharge Diagnosis Patient Active Problem List   Diagnosis Date Noted   Ruptured appendicitis 07/12/2024   Claudication (HCC) 05/06/2022   Elevated coronary artery calcium  score 09/12/2019   Dyslipidemia 09/12/2019   SVT (supraventricular tachycardia) (HCC)    Hyperlipidemia    GERD (gastroesophageal reflux disease)    CAD (coronary artery disease)    Essential tremor 07/06/2017   Nonspecific (abnormal) findings on radiological and other examination of gastrointestinal tract 03/09/2012   RLQ abdominal pain 03/09/2012   Palpitations 04/29/2011   OTHER ACQUIRED ABSENCE OF ORGAN 01/08/2009   THYROID  NODULE, RIGHT 01/07/2009   HYPOTHYROIDISM 01/07/2009   IRRITABLE BOWEL SYNDROME 01/07/2009   NEPHROLITHIASIS, HX OF 01/07/2009   HYPERTRIGLYCERIDEMIA 12/25/2008   HYPERLIPIDEMIA-MIXED 12/25/2008   SVT/ PSVT/ PAT 12/25/2008   CHEST PAIN-UNSPECIFIED 12/25/2008   HEARTBURN 12/25/2008    Consultants none  Reason for Admission: 68 yo female with 1 week of abdominal pain. It started as epigastric. It moved lower. She has IBS and tried treating IBS/constipative symptoms without improvement. She has had more watery bowel movements over the last 2 days. She has been more bloated and went to the doctor yesterday and got a CT scan today and then came to the ED. She has nausea.   Procedures none  Hospital Course:  The patient was admitted and placed on IV Zosyn .  Conservative management was recommended and the fluid collection was unable to be drained.  Her diet was able to be gradually advanced as tolerated.  Her pain progressively improved.  Her WBC normalized.  She had a repeat CT scan prior to DC that revealed a decrease in the size of the appendix, but with a new small fluid collection c/w phlegmon.  She  was otherwise feeling much better and stable to DC home with another week of abx therapy.  She has had a colonoscopy in the last year.  We will have her follow up with Dr. Stevie to discuss possible interval appendectomy in 6-8 weeks.  Physical Exam: Gen: NAD Heart: regular Lungs: CTAB Abd: soft, NT, minimal bloating  Allergies as of 07/17/2024       Reactions   Codeine Nausea And Vomiting, Swelling        Medication List     TAKE these medications    acetaminophen  500 MG tablet Commonly known as: TYLENOL  Take 2 tablets (1,000 mg total) by mouth every 6 (six) hours as needed for mild pain (pain score 1-3) (or temp > 100).   amoxicillin -clavulanate 875-125 MG tablet Commonly known as: AUGMENTIN  Take 1 tablet by mouth 2 (two) times daily for 7 days.   dicyclomine  10 MG capsule Commonly known as: BENTYL  Take 10 mg by mouth daily as needed for spasms.   escitalopram  10 MG tablet Commonly known as: LEXAPRO  Take 10 mg by mouth at bedtime.   famotidine  40 MG tablet Commonly known as: Pepcid  Take 1 tablet (40 mg total) by mouth at bedtime.   fenofibrate 145 MG tablet Commonly known as: TRICOR Take 145 mg by mouth daily.   levothyroxine 112 MCG tablet Commonly known as: SYNTHROID Take 112 mcg by mouth daily before breakfast.   ondansetron  4 MG disintegrating tablet Commonly known as: ZOFRAN -ODT Take 1 tablet (4 mg total) by mouth every 6 (six) hours as needed for nausea.   oxyCODONE  5 MG immediate release tablet Commonly  known as: Oxy IR/ROXICODONE  Take 1 tablet (5 mg total) by mouth every 6 (six) hours as needed (pain).   primidone  50 MG tablet Commonly known as: MYSOLINE  Take 50 mg by mouth in the morning.   rosuvastatin  20 MG tablet Commonly known as: CRESTOR  TAKE 1 TABLET BY MOUTH DAILY   Vitamin D3 50 MCG (2000 UT) Tabs Take 2,000 Units by mouth daily.          Follow-up Information     Kinsinger, Herlene Righter, MD Follow up on 08/16/2024.    Specialty: General Surgery Why: 10:50am, Arrive 30 minutes prior to your appointment time, Please bring your insurance card and photo ID Contact information: [address] [place] [address] [phone]                 Signed: Burnard Banter, [facility] Surgery 07/17/2024, 10:44 AM Please see Amion for pager number during day hours 7:00am-4:30pm, 7-11:30am on Weekends

## 2024-07-17 NOTE — Plan of Care (Signed)
   Problem: Education: Goal: Knowledge of General Education information will improve Description Including pain rating scale, medication(s)/side effects and non-pharmacologic comfort measures Outcome: Progressing   Problem: Health Behavior/Discharge Planning: Goal: Ability to manage health-related needs will improve Outcome: Progressing

## 2024-07-23 LAB — TISSUE TRANSGLUTAMINASE, IGA

## 2024-07-23 LAB — IGA: Immunoglobulin A: 348 mg/dL — ABNORMAL HIGH (ref 70–320)

## 2024-08-02 ENCOUNTER — Ambulatory Visit: Admitting: Physician Assistant

## 2024-08-03 ENCOUNTER — Other Ambulatory Visit: Payer: Self-pay | Admitting: Cardiology

## 2024-08-03 DIAGNOSIS — E785 Hyperlipidemia, unspecified: Secondary | ICD-10-CM

## 2024-08-08 ENCOUNTER — Ambulatory Visit: Admitting: Physician Assistant

## 2024-08-16 DIAGNOSIS — K3532 Acute appendicitis with perforation and localized peritonitis, without abscess: Secondary | ICD-10-CM | POA: Diagnosis not present

## 2024-08-20 NOTE — Progress Notes (Signed)
 Sent message, via epic in basket, requesting orders in epic from Careers adviser.

## 2024-08-21 NOTE — Progress Notes (Signed)
  Date of COVID positive in last 90 days:  PCP - Lonell Collet, DO Cardiologist - Lynwood Schilling, MD  Chest x-ray - N/A EKG - 05-17-24 Epic Stress Test - 04-14-21 Epic ECHO - N/A Cardiac Cath - 2007 Cardiac CT - 06-28-18 Epic Pacemaker/ICD device last checked:N/A Spinal Cord Stimulator:N/A  Bowel Prep - N/A  Sleep Study - N/A CPAP -   Fasting Blood Sugar - N/A Checks Blood Sugar _____ times a day  Last dose of GLP1 agonist-  N/A GLP1 instructions:  Do not take after     Last dose of SGLT-2 inhibitors-  N/A SGLT-2 instructions:  Do not take after     Blood Thinner Instructions: N/A Last dose:   Time: Aspirin Instructions:N/A Last Dose:  Activity level:  Can go up a flight of stairs and perform activities of daily living without stopping and without symptoms of chest pain or shortness of breath.  Able to exercise without symptoms  Unable to go up a flight of stairs without symptoms of     Anesthesia review: CAD, Wolff-Parkinson-White, SVT with hx of ablation  Patient denies shortness of breath, fever, cough and chest pain at PAT appointment  Patient verbalized understanding of instructions that were given to them at the PAT appointment. Patient was also instructed that they will need to review over the PAT instructions again at home before surgery.

## 2024-08-21 NOTE — Patient Instructions (Addendum)
 SURGICAL WAITING ROOM VISITATION Patients having surgery or a procedure may have no more than 2 support people in the waiting area - these visitors may rotate.    Children under the age of 22 must have an adult with them who is not the patient.  If the patient needs to stay at the hospital during part of their recovery, the visitor guidelines for inpatient rooms apply. Pre-op nurse will coordinate an appropriate time for 1 support person to accompany patient in pre-op.  This support person may not rotate.    Please refer to the [facility] website for the visitor guidelines for Inpatients (after your surgery is over and you are in a regular room).       Your procedure is scheduled on: 08-27-24   Report to [facility] Main Entrance    Report to admitting at 11:15 AM   Call this number if you have problems the morning of surgery [phone]   Do not eat food :After Midnight.   After Midnight you may have the following liquids until 10:25 AM DAY OF SURGERY  Water Non-Citrus Juices (without pulp, NO RED-Apple, White grape, White cranberry) Black Coffee (NO MILK/CREAM OR CREAMERS, sugar ok)  Clear Tea (NO MILK/CREAM OR CREAMERS, sugar ok) regular and decaf                             Plain Jell-O (NO RED)                                           Fruit ices (not with fruit pulp, NO RED)                                     Popsicles (NO RED)                                                               Sports drinks like Gatorade (NO RED)                      If you have questions, please contact your surgeon's office.   FOLLOW ANY ADDITIONAL PRE OP INSTRUCTIONS YOU RECEIVED FROM YOUR SURGEON'S OFFICE!!!     Oral Hygiene is also important to reduce your risk of infection.                                    Remember - BRUSH YOUR TEETH THE MORNING OF SURGERY WITH YOUR REGULAR TOOTHPASTE   Do NOT smoke after Midnight   Take these medicines the morning of surgery with A  SIP OF WATER:    Fenofibrate   Levothyroxine   Primidone    Rosuvastatin   Stop all vitamins and herbal supplements 7 days before surgery                              You may not have any metal on your body including  hair pins, jewelry, and body piercing             Do not wear make-up, lotions, powders, perfumes or deodorant  Do not wear nail polish including gel and S&S, artificial/acrylic nails, or any other type of covering on natural nails including finger and toenails. If you have artificial nails, gel coating, etc. that needs to be removed by a nail salon please have this removed prior to surgery or surgery may need to be canceled/ delayed if the surgeon/ anesthesia feels like they are unable to be safely monitored.   Do not shave  48 hours prior to surgery.          Do not bring valuables to the hospital. [facility] IS NOT RESPONSIBLE   FOR VALUABLES.   Contacts, dentures or bridgework may not be worn into surgery.   DO NOT BRING YOUR HOME MEDICATIONS TO THE HOSPITAL. PHARMACY WILL DISPENSE MEDICATIONS LISTED ON YOUR MEDICATION LIST TO YOU DURING YOUR ADMISSION IN THE HOSPITAL!    Patients discharged on the day of surgery will not be allowed to drive home.  Someone NEEDS to stay with you for the first 24 hours after anesthesia.              Please read over the following fact sheets you were given: IF YOU HAVE QUESTIONS ABOUT YOUR PRE-OP INSTRUCTIONS PLEASE CALL [phone] [facility]  If you received a COVID test during your pre-op visit  it is requested that you wear a mask when out in public, stay away from anyone that may not be feeling well and notify your surgeon if you develop symptoms. If you test positive for Covid or have been in contact with anyone that has tested positive in the last 10 days please notify you surgeon. [place] - Preparing for Surgery Before surgery, you can play an important role.  Because skin is not sterile, your skin needs to be as free of germs as  possible.  You can reduce the number of germs on your skin by washing with CHG (chlorahexidine gluconate) soap before surgery.  CHG is an antiseptic cleaner which kills germs and bonds with the skin to continue killing germs even after washing. Please DO NOT use if you have an allergy to CHG or antibacterial soaps.  If your skin becomes reddened/irritated stop using the CHG and inform your nurse when you arrive at Short Stay. Do not shave (including legs and underarms) for at least 48 hours prior to the first CHG shower.  You may shave your face/neck.  Please follow these instructions carefully:  1.  Shower with CHG Soap the night before surgery ONLY (DO NOT USE THE SOAP THE MORNING OF SURGERY).  2.  If you choose to wash your hair, wash your hair first as usual with your normal  shampoo.  3.  After you shampoo, rinse your hair and body thoroughly to remove the shampoo.                             4.  Use CHG as you would any other liquid soap.  You can apply chg directly to the skin and wash.  Gently with a scrungie or clean washcloth.  5.  Apply the CHG Soap to your body ONLY FROM THE NECK DOWN.   Do   not use on face/ open  Wound or open sores. Avoid contact with eyes, ears mouth and   genitals (private parts).                       Wash face,  Genitals (private parts) with your normal soap.             6.  Wash thoroughly, paying special attention to the area where your    surgery  will be performed.  7.  Thoroughly rinse your body with warm water from the neck down.  8.  DO NOT shower/wash with your normal soap after using and rinsing off the CHG Soap.                9.  Pat yourself dry with a clean towel.            10.  Wear clean pajamas.            11.  Place clean sheets on your bed the night of your first shower and do not  sleep with pets. Day of Surgery : Do not apply any CHG, lotions/deodorants the morning of surgery.  Please wear clean clothes to the  hospital/surgery center.  FAILURE TO FOLLOW THESE INSTRUCTIONS MAY RESULT IN THE CANCELLATION OF YOUR SURGERY  PATIENT SIGNATURE_________________________________  NURSE SIGNATURE__________________________________  ________________________________________________________________________

## 2024-08-22 ENCOUNTER — Other Ambulatory Visit: Payer: Self-pay

## 2024-08-22 ENCOUNTER — Encounter (HOSPITAL_COMMUNITY)
Admission: RE | Admit: 2024-08-22 | Discharge: 2024-08-22 | Disposition: A | Discharge status: Home / Self Care | Source: Ambulatory Visit | Attending: General Surgery | Admitting: General Surgery

## 2024-08-22 ENCOUNTER — Encounter (HOSPITAL_COMMUNITY): Payer: Self-pay

## 2024-08-22 VITALS — BP 111/81 | HR 77 | Temp 98.0°F | Resp 16 | Ht 65.0 in | Wt 166.8 lb

## 2024-08-22 DIAGNOSIS — Z01818 Encounter for other preprocedural examination: Secondary | ICD-10-CM

## 2024-08-22 DIAGNOSIS — E039 Hypothyroidism, unspecified: Secondary | ICD-10-CM | POA: Insufficient documentation

## 2024-08-22 DIAGNOSIS — I471 Supraventricular tachycardia, unspecified: Secondary | ICD-10-CM | POA: Insufficient documentation

## 2024-08-22 DIAGNOSIS — I2510 Atherosclerotic heart disease of native coronary artery without angina pectoris: Secondary | ICD-10-CM | POA: Diagnosis not present

## 2024-08-22 DIAGNOSIS — K3532 Acute appendicitis with perforation and localized peritonitis, without abscess: Secondary | ICD-10-CM | POA: Insufficient documentation

## 2024-08-22 DIAGNOSIS — Z01812 Encounter for preprocedural laboratory examination: Secondary | ICD-10-CM | POA: Insufficient documentation

## 2024-08-22 HISTORY — DX: Unspecified osteoarthritis, unspecified site: M19.90

## 2024-08-22 HISTORY — DX: Migraine, unspecified, not intractable, without status migrainosus: G43.909

## 2024-08-22 HISTORY — DX: Unspecified malignant neoplasm of skin of other parts of face: C44.309

## 2024-08-22 LAB — BASIC METABOLIC PANEL WITH GFR
Anion gap: 11 (ref 5–15)
BUN: 16 mg/dL (ref 8–23)
CO2: 22 mmol/L (ref 22–32)
Calcium: 8.8 mg/dL — ABNORMAL LOW (ref 8.9–10.3)
Chloride: 106 mmol/L (ref 98–111)
Creatinine, Ser: 0.98 mg/dL (ref 0.44–1.00)
GFR, Estimated: >60 mL/min (ref >60)
Glucose, Bld: 122 mg/dL — ABNORMAL HIGH (ref 70–99)
Potassium: 4.3 mmol/L (ref 3.5–5.1)
Sodium: 139 mmol/L (ref 135–145)

## 2024-08-22 LAB — CBC
HCT: 46.3 % — ABNORMAL HIGH (ref 36.0–46.0)
Hemoglobin: 14.4 g/dL (ref 12.0–15.0)
MCH: 29.2 pg (ref 26.0–34.0)
MCHC: 31.1 g/dL (ref 30.0–36.0)
MCV: 93.9 fL (ref 80.0–100.0)
Platelets: 408 K/uL — ABNORMAL HIGH (ref 150–400)
RBC: 4.93 MIL/uL (ref 3.87–5.11)
RDW: 14.8 % (ref 11.5–15.5)
WBC: 9.1 K/uL (ref 4.0–10.5)
nRBC: 0 % (ref 0.0–0.2)

## 2024-08-23 NOTE — Progress Notes (Signed)
 Anesthesia Chart Review   Case: [id number] Date/Time: 08/27/24 1311   Procedure: APPENDECTOMY, LAPAROSCOPIC   Anesthesia type: General   Pre-op diagnosis: PERFORATED APPENDICITIS   Location[facility] ROOM 05 / [facility]   Surgeons: Stevie Herlene Righter, MD       DISCUSSION:67 y.o. never smoker with h/o hypothyroidism, SVT s/p ablation 2007, CAD, perforated appendicitis scheduled for above procedure 08/27/24 with Dr. Herlene Stevie.   Negative POET 2019 and 2022.   Last seen by cardiology 05/17/2024. Pt with no sx. Advised to follow up in 3 years or as needed.  VS: BP 111/81   Pulse 77   Temp 36.7 C (Oral)   Resp 16   Ht 5' 5 (1.651 m)   Wt 75.7 kg   SpO2 98%   BMI 27.76 kg/m   PROVIDERS: Gerome Brunet, DO is PCP   Cardiologist - Lynwood Schilling, MD  LABS: Labs reviewed: Acceptable for surgery. (all labs ordered are listed, but only abnormal results are displayed)  Labs Reviewed  BASIC METABOLIC PANEL WITH GFR - Abnormal; Notable for the following components:      Result Value   Glucose, Bld 122 (*)    Calcium  8.8 (*)    All other components within normal limits  CBC - Abnormal; Notable for the following components:   HCT 46.3 (*)    Platelets 408 (*)    All other components within normal limits     IMAGES:   EKG:   CV:  Past Medical History:  Diagnosis Date   Anxiety    Arthritis    CAD (coronary artery disease)    minimal plaque by cath 4/07: mLAD 30%, EF 60%   Depression    Essential tremor    GERD (gastroesophageal reflux disease)    Hepatic steatosis 03/08/2012   Hiatal hernia    History of IBS    History of kidney stones    Hyperglycemia    Borderline   Hyperlipidemia    Hypothyroidism    Migraine headache    Right thyroid  nodule     s/p thyroid  biopsy; report states follicular epitehlial cells, histiocytes, colloid, and lymphocytes (thyroiditis). Suggest a non-neoplastic process such as a hyperplastic nodule/ non-neoplastic goiter in a background  of thyroiditis. Thyroid  biopsy was done under a thyroid  ultrasound at [facility].   Skin cancer of forehead    SVT (supraventricular tachycardia)    Hx of, s/p EP study with catheter ablation of AV nodal; re-entry tachycardia 01/10/06, s/p adenosine myoview, 10/06 showing an EF of 70% with no ischemia   Wolff-Parkinson-White (WPW) pattern     Past Surgical History:  Procedure Laterality Date   ABLATION     SVT   BIOPSY THYROID      CARDIAC ELECTROPHYSIOLOGY MAPPING AND ABLATION  2007   CESAREAN SECTION     x2 via lower midline   EXTRACORPOREAL SHOCK WAVE LITHOTRIPSY Left 12/13/2016   Procedure: LEFT EXTRACORPOREAL SHOCK WAVE LITHOTRIPSY (ESWL);  Surgeon: Donnice Brooks, MD;  Location: [facility];  Service: Urology;  Laterality: Left;   LAPAROSCOPIC CHOLECYSTECTOMY W/ CHOLANGIOGRAPHY     MOHS SURGERY     Forehead   TOTAL THYROIDECTOMY  11/08/2007   TUBAL LIGATION      MEDICATIONS:  acetaminophen  (TYLENOL ) 500 MG tablet   cetirizine (ZYRTEC) 10 MG tablet   Cholecalciferol (VITAMIN D3) 50 MCG (2000 UT) capsule   dicyclomine  (BENTYL ) 10 MG capsule   escitalopram  (LEXAPRO ) 10 MG tablet   famotidine  (PEPCID ) 40 MG tablet  fenofibrate (TRICOR) 145 MG tablet   levothyroxine (SYNTHROID, LEVOTHROID) 112 MCG tablet   ondansetron  (ZOFRAN -ODT) 4 MG disintegrating tablet   oxyCODONE  (OXY IR/ROXICODONE ) 5 MG immediate release tablet   primidone  (MYSOLINE ) 50 MG tablet   rosuvastatin  (CRESTOR ) 20 MG tablet   No current facility-administered medications for this encounter.      Harlene Hoots Ward, PA-C [facility]urgical [facility] [phone]

## 2024-08-27 ENCOUNTER — Encounter (HOSPITAL_COMMUNITY)
Admission: RE | Disposition: A | Discharge status: Home / Self Care | Payer: Self-pay | Source: Home / Self Care | Attending: General Surgery

## 2024-08-27 ENCOUNTER — Encounter (HOSPITAL_COMMUNITY): Payer: Self-pay | Admitting: General Surgery

## 2024-08-27 ENCOUNTER — Ambulatory Visit (HOSPITAL_COMMUNITY): Payer: Self-pay | Admitting: Physician Assistant

## 2024-08-27 ENCOUNTER — Ambulatory Visit (HOSPITAL_COMMUNITY): Payer: Self-pay

## 2024-08-27 ENCOUNTER — Other Ambulatory Visit: Payer: Self-pay

## 2024-08-27 ENCOUNTER — Ambulatory Visit (HOSPITAL_COMMUNITY)
Admission: RE | Admit: 2024-08-27 | Discharge: 2024-08-27 | Disposition: A | Discharge status: Home / Self Care | Attending: General Surgery | Admitting: General Surgery

## 2024-08-27 DIAGNOSIS — E039 Hypothyroidism, unspecified: Secondary | ICD-10-CM | POA: Diagnosis not present

## 2024-08-27 DIAGNOSIS — M1990 Unspecified osteoarthritis, unspecified site: Secondary | ICD-10-CM | POA: Insufficient documentation

## 2024-08-27 DIAGNOSIS — K449 Diaphragmatic hernia without obstruction or gangrene: Secondary | ICD-10-CM | POA: Insufficient documentation

## 2024-08-27 DIAGNOSIS — F418 Other specified anxiety disorders: Secondary | ICD-10-CM | POA: Insufficient documentation

## 2024-08-27 DIAGNOSIS — K219 Gastro-esophageal reflux disease without esophagitis: Secondary | ICD-10-CM | POA: Diagnosis not present

## 2024-08-27 DIAGNOSIS — D121 Benign neoplasm of appendix: Secondary | ICD-10-CM | POA: Insufficient documentation

## 2024-08-27 DIAGNOSIS — K388 Other specified diseases of appendix: Secondary | ICD-10-CM | POA: Diagnosis not present

## 2024-08-27 DIAGNOSIS — I2510 Atherosclerotic heart disease of native coronary artery without angina pectoris: Secondary | ICD-10-CM | POA: Diagnosis not present

## 2024-08-27 DIAGNOSIS — K3532 Acute appendicitis with perforation and localized peritonitis, without abscess: Secondary | ICD-10-CM

## 2024-08-27 HISTORY — PX: LAPAROSCOPIC APPENDECTOMY: SHX408

## 2024-08-27 SURGERY — APPENDECTOMY, LAPAROSCOPIC
Anesthesia: General

## 2024-08-27 MED ORDER — DROPERIDOL 2.5 MG/ML IJ SOLN
INTRAMUSCULAR | Status: AC
  Filled 2024-08-27: qty 2

## 2024-08-27 MED ORDER — ONDANSETRON HCL 4 MG/2ML IJ SOLN
INTRAMUSCULAR | Status: AC
Start: 2024-08-27 — End: 2024-08-27
  Filled 2024-08-27: qty 2

## 2024-08-27 MED ORDER — DEXMEDETOMIDINE HCL IN NACL 80 MCG/20ML IV SOLN
INTRAVENOUS | Status: DC | PRN
  Administered 2024-08-27: 8 ug via INTRAVENOUS

## 2024-08-27 MED ORDER — LACTATED RINGERS IR SOLN
Status: DC | PRN
  Administered 2024-08-27: 1000 mL

## 2024-08-27 MED ORDER — LACTATED RINGERS IV SOLN: INTRAVENOUS | Status: DC

## 2024-08-27 MED ORDER — CELECOXIB 200 MG PO CAPS
400.0000 mg | ORAL_CAPSULE | ORAL | Status: AC
  Administered 2024-08-27: 400 mg via ORAL
  Filled 2024-08-27: qty 2

## 2024-08-27 MED ORDER — LIDOCAINE HCL (PF) 2 % IJ SOLN
INTRAMUSCULAR | Status: DC | PRN
Start: 2024-08-27 — End: 2024-08-27
  Administered 2024-08-27: 60 mg via INTRADERMAL

## 2024-08-27 MED ORDER — DROPERIDOL 2.5 MG/ML IJ SOLN
0.6250 mg | Freq: Once | INTRAMUSCULAR | Status: AC | PRN
  Administered 2024-08-27: 0.625 mg via INTRAVENOUS

## 2024-08-27 MED ORDER — OXYCODONE HCL 5 MG/5ML PO SOLN: 5.0000 mg | Freq: Once | ORAL | Status: AC | PRN

## 2024-08-27 MED ORDER — BUPIVACAINE-EPINEPHRINE (PF) 0.25% -1:200000 IJ SOLN
INTRAMUSCULAR | Status: AC
  Filled 2024-08-27: qty 30

## 2024-08-27 MED ORDER — OXYCODONE HCL 5 MG PO TABS
5.0000 mg | ORAL_TABLET | Freq: Once | ORAL | Status: AC | PRN
  Administered 2024-08-27: 5 mg via ORAL

## 2024-08-27 MED ORDER — CHLORHEXIDINE GLUCONATE 0.12 % MT SOLN
15.0000 mL | Freq: Once | OROMUCOSAL | Status: AC
  Administered 2024-08-27: 15 mL via OROMUCOSAL

## 2024-08-27 MED ORDER — ROCURONIUM BROMIDE 10 MG/ML (PF) SYRINGE
PREFILLED_SYRINGE | INTRAVENOUS | Status: AC
  Filled 2024-08-27: qty 10

## 2024-08-27 MED ORDER — TRAMADOL HCL 50 MG PO TABS: 50.0000 mg | ORAL_TABLET | Freq: Four times a day (QID) | ORAL | 0 refills | Status: AC | PRN

## 2024-08-27 MED ORDER — ACETAMINOPHEN 500 MG PO TABS
1000.0000 mg | ORAL_TABLET | ORAL | Status: AC
  Administered 2024-08-27: 1000 mg via ORAL
  Filled 2024-08-27: qty 2

## 2024-08-27 MED ORDER — PHENYLEPHRINE 80 MCG/ML (10ML) SYRINGE FOR IV PUSH (FOR BLOOD PRESSURE SUPPORT)
PREFILLED_SYRINGE | INTRAVENOUS | Status: DC | PRN
  Administered 2024-08-27: 120 ug via INTRAVENOUS

## 2024-08-27 MED ORDER — BUPIVACAINE-EPINEPHRINE 0.25% -1:200000 IJ SOLN
INTRAMUSCULAR | Status: DC | PRN
  Administered 2024-08-27: 30 mL

## 2024-08-27 MED ORDER — HYDROMORPHONE HCL 1 MG/ML IJ SOLN
0.2500 mg | INTRAMUSCULAR | Status: DC | PRN
  Administered 2024-08-27: 0.5 mg via INTRAVENOUS

## 2024-08-27 MED ORDER — PROPOFOL 10 MG/ML IV BOLUS
INTRAVENOUS | Status: DC | PRN
  Administered 2024-08-27: 160 mg via INTRAVENOUS

## 2024-08-27 MED ORDER — FENTANYL CITRATE (PF) 100 MCG/2ML IJ SOLN
INTRAMUSCULAR | Status: AC
  Filled 2024-08-27: qty 2

## 2024-08-27 MED ORDER — SODIUM CHLORIDE 0.9 % IR SOLN
Status: DC | PRN
  Administered 2024-08-27: 1000 mL

## 2024-08-27 MED ORDER — SUGAMMADEX SODIUM 200 MG/2ML IV SOLN
INTRAVENOUS | Status: AC
Start: 2024-08-27 — End: 2024-08-27
  Filled 2024-08-27: qty 2

## 2024-08-27 MED ORDER — ONDANSETRON HCL 4 MG/2ML IJ SOLN
INTRAMUSCULAR | Status: AC
  Filled 2024-08-27: qty 2

## 2024-08-27 MED ORDER — ONDANSETRON HCL 4 MG/2ML IJ SOLN
INTRAMUSCULAR | Status: DC | PRN
  Administered 2024-08-27: 4 mg via INTRAVENOUS

## 2024-08-27 MED ORDER — CHLORHEXIDINE GLUCONATE CLOTH 2 % EX PADS: 6.0000 | MEDICATED_PAD | Freq: Once | CUTANEOUS | Status: DC

## 2024-08-27 MED ORDER — SUGAMMADEX SODIUM 200 MG/2ML IV SOLN
INTRAVENOUS | Status: DC | PRN
  Administered 2024-08-27: 200 mg via INTRAVENOUS

## 2024-08-27 MED ORDER — PROPOFOL 10 MG/ML IV BOLUS
INTRAVENOUS | Status: AC
  Filled 2024-08-27: qty 20

## 2024-08-27 MED ORDER — DEXAMETHASONE SOD PHOSPHATE PF 10 MG/ML IJ SOLN
INTRAMUSCULAR | Status: DC | PRN
  Administered 2024-08-27: 8 mg via INTRAVENOUS

## 2024-08-27 MED ORDER — FENTANYL CITRATE (PF) 100 MCG/2ML IJ SOLN
INTRAMUSCULAR | Status: DC | PRN
  Administered 2024-08-27: 100 ug via INTRAVENOUS
  Administered 2024-08-27: 50 ug via INTRAVENOUS
  Administered 2024-08-27 (×2): 25 ug via INTRAVENOUS

## 2024-08-27 MED ORDER — HYDROMORPHONE HCL 1 MG/ML IJ SOLN
INTRAMUSCULAR | Status: AC
  Filled 2024-08-27: qty 1

## 2024-08-27 MED ORDER — IBUPROFEN 800 MG PO TABS: 800.0000 mg | ORAL_TABLET | Freq: Three times a day (TID) | ORAL | 0 refills | Status: AC | PRN

## 2024-08-27 MED ORDER — OXYCODONE HCL 5 MG PO TABS
ORAL_TABLET | ORAL | Status: AC
  Filled 2024-08-27: qty 1

## 2024-08-27 MED ORDER — CEFAZOLIN SODIUM-DEXTROSE 2-4 GM/100ML-% IV SOLN
2.0000 g | INTRAVENOUS | Status: AC
  Administered 2024-08-27: 2 g via INTRAVENOUS
  Filled 2024-08-27: qty 100

## 2024-08-27 MED ORDER — ORAL CARE MOUTH RINSE: 15.0000 mL | Freq: Once | OROMUCOSAL | Status: AC

## 2024-08-27 MED ORDER — ONDANSETRON HCL 4 MG/2ML IJ SOLN
4.0000 mg | Freq: Once | INTRAMUSCULAR | Status: AC | PRN
  Administered 2024-08-27: 4 mg via INTRAVENOUS

## 2024-08-27 MED ORDER — ROCURONIUM BROMIDE 10 MG/ML (PF) SYRINGE
PREFILLED_SYRINGE | INTRAVENOUS | Status: AC
Start: 2024-08-27 — End: 2024-08-27
  Filled 2024-08-27: qty 10

## 2024-08-27 MED ORDER — ROCURONIUM BROMIDE 10 MG/ML (PF) SYRINGE
PREFILLED_SYRINGE | INTRAVENOUS | Status: DC | PRN
  Administered 2024-08-27: 60 mg via INTRAVENOUS

## 2024-08-27 SURGICAL SUPPLY — 37 items
BAG COUNTER SPONGE SURGICOUNT (BAG) IMPLANT
CLIP APPLIE 5 13 M/L LIGAMAX5 (MISCELLANEOUS) IMPLANT
CLIP APPLIE ROT 10 11.4 M/L (STAPLE) IMPLANT
CLIP LIGATING HEMO LOK XL GOLD (MISCELLANEOUS) ×1 IMPLANT
COVER SURGICAL LIGHT HANDLE (MISCELLANEOUS) ×1 IMPLANT
CUTTER ECHEON FLEX ENDO 45 340 (ENDOMECHANICALS) IMPLANT
DERMABOND ADVANCED .7 DNX12 (GAUZE/BANDAGES/DRESSINGS) ×1 IMPLANT
DRAIN CHANNEL 19F RND (DRAIN) IMPLANT
DRAPE LAPAROSCOPIC ABDOMINAL (DRAPES) ×1 IMPLANT
ELECT REM PT RETURN 15FT ADLT (MISCELLANEOUS) ×1 IMPLANT
ENDOLOOP SUT PDS II 0 18 (SUTURE) IMPLANT
EVACUATOR SILICONE 100CC (DRAIN) IMPLANT
GLOVE BIOGEL PI IND STRL 7.0 (GLOVE) ×1 IMPLANT
GLOVE SURG SS PI 7.0 STRL IVOR (GLOVE) ×1 IMPLANT
GOWN STRL REUS W/ TWL LRG LVL3 (GOWN DISPOSABLE) ×1 IMPLANT
GOWN STRL REUS W/ TWL XL LVL3 (GOWN DISPOSABLE) IMPLANT
GRASPER SUT TROCAR 14GX15 (MISCELLANEOUS) IMPLANT
IRRIGATION SUCT STRKRFLW 2 WTP (MISCELLANEOUS) ×1 IMPLANT
KIT BASIN OR (CUSTOM PROCEDURE TRAY) ×1 IMPLANT
KIT TURNOVER KIT A (KITS) ×1 IMPLANT
PENCIL SMOKE EVACUATOR (MISCELLANEOUS) IMPLANT
POUCH RETRIEVAL ECOSAC 10 (ENDOMECHANICALS) ×1 IMPLANT
RELOAD STAPLE 45 2.6 WHT THIN (STAPLE) IMPLANT
RELOAD STAPLE 45 3.6 BLU REG (STAPLE) IMPLANT
SCISSORS LAP 5X35 DISP (ENDOMECHANICALS) ×1 IMPLANT
SET TUBE SMOKE EVAC HIGH FLOW (TUBING) ×1 IMPLANT
SHEARS HARMONIC 36 ACE (MISCELLANEOUS) IMPLANT
SLEEVE Z-THREAD 5X100MM (TROCAR) ×1 IMPLANT
SPIKE FLUID TRANSFER (MISCELLANEOUS) ×1 IMPLANT
STRIP CLOSURE SKIN 1/4X4 (GAUZE/BANDAGES/DRESSINGS) ×1 IMPLANT
SUT ETHILON 2 0 PS N (SUTURE) IMPLANT
SUT MNCRL AB 4-0 PS2 18 (SUTURE) ×1 IMPLANT
SUT VICRYL 0 UR6 27IN ABS (SUTURE) ×1 IMPLANT
TOWEL OR 17X26 10 PK STRL BLUE (TOWEL DISPOSABLE) ×1 IMPLANT
TRAY LAPAROSCOPIC (CUSTOM PROCEDURE TRAY) ×1 IMPLANT
TROCAR ADV FIXATION 12X100MM (TROCAR) ×1 IMPLANT
TROCAR Z-THREAD OPTICAL 5X100M (TROCAR) ×1 IMPLANT

## 2024-08-27 NOTE — Anesthesia Preprocedure Evaluation (Addendum)
 Anesthesia Evaluation  Patient identified by MRN, date of birth, ID band Patient awake    Reviewed: Allergy & Precautions, NPO status , Patient's Chart, lab work & pertinent test results  Airway Mallampati: III     Mouth opening: Limited Mouth Opening Comment: Small mouth Dental no notable dental hx. (+) Teeth Intact, Dental Advisory Given   Pulmonary neg pulmonary ROS   Pulmonary exam normal breath sounds clear to auscultation       Cardiovascular + CAD  Normal cardiovascular exam Rhythm:Regular Rate:Normal     Neuro/Psych  Headaches PSYCHIATRIC DISORDERS Anxiety Depression       GI/Hepatic Neg liver ROS, hiatal hernia,GERD  Medicated,,Acute appendicitis with perforation   Endo/Other  Hypothyroidism    Renal/GU negative Renal ROS  negative genitourinary   Musculoskeletal  (+) Arthritis , Osteoarthritis,    Abdominal  (+)  Abdomen: tender.   Peds  Hematology negative hematology ROS (+)   Anesthesia Other Findings   Reproductive/Obstetrics                              Anesthesia Physical Anesthesia Plan  ASA: 2  Anesthesia Plan: General   Post-op Pain Management: Dilaudid  IV, Precedex and Tylenol  PO (pre-op)*   Induction: Intravenous and Cricoid pressure planned  PONV Risk Score and Plan: 4 or greater and Treatment may vary due to age or medical condition, Ondansetron  and Dexamethasone  Airway Management Planned: Oral ETT and Video Laryngoscope Planned  Additional Equipment: None  Intra-op Plan:   Post-operative Plan: Extubation in OR  Informed Consent: I have reviewed the patients History and Physical, chart, labs and discussed the procedure including the risks, benefits and alternatives for the proposed anesthesia with the patient or authorized representative who has indicated his/her understanding and acceptance.     Dental advisory given  Plan Discussed with: CRNA  and Anesthesiologist  Anesthesia Plan Comments:          Anesthesia Quick Evaluation

## 2024-08-27 NOTE — Op Note (Signed)
 Preoperative diagnosis: history of perforated appendicitis  Postoperative diagnosis: Same   Procedure: laparoscopic appendectomy  Surgeon: Herlene Bureau, M.D.  Asst: Larnell Costain, M.D.  Anesthesia: Gen.   Indications for procedure: [name] is a 68 y.o. female with symptoms of abdominal pain and findings of perforated appendicitis. She was treated nonoperatively and presents for interval appendectomy  Description of procedure: The patient was brought into the operative suite, placed supine. Anesthesia was administered with endotracheal tube. The patient's left arm was tucked. All pressure points were offloaded by foam padding. The patient was prepped and draped in the usual sterile fashion.  A transverse incision was made to the left subcostal area and a 5 mm trocar was us . Pneumoperitoneum was applied with high flow low pressure.  1 5mm trocar was placed in the suprapubic space, and 1 12 mm trocar was placed in the umbilical space. Next, the patient was placed in trendelenberg, rotated to the left. The omentum was retracted cephalad. The cecum and appendix were identified. The appendix was medial and scarred against the mesentery of the ileum. Blunt dissection was used to free the appendix and cautery was used to free the appendix posteriorly. The base of the appendix was dissected and a window through the mesoappendix was created with blunt dissection. A 60 mm blue load stapler was used to cut the appendix at its base. The artery was taken with a large hem-o-lock.  The appendix was placed in a specimen bag. The pelvis and RLQ were irrigated. No bleeding was seen. The appendix was removed via the 12 mm trocar. 0 vicryl was used to close the fascial defect. Pneumoperitoneum was removed, all trocars were removed. All incisions were closed with 4-0 monocryl subcuticular stitch. The patient woke from anesthesia and was brought to PACU in stable condition.  Findings: scar of the  appendix  Specimen: appendix  Blood loss: 20 ml  Local anesthesia: 30 ml Marcaine  Complications: none  Herlene Bureau, M.D. General, Bariatric, & Minimally Invasive Surgery [facility]

## 2024-08-27 NOTE — Anesthesia Procedure Notes (Signed)
 Procedure Name: Intubation Date/Time: 08/27/2024 2:57 PM  Performed by: Roslynn Waddell LABOR, CRNAPre-anesthesia Checklist: Patient identified, Emergency Drugs available, Suction available and Patient being monitored Patient Re-evaluated:Patient Re-evaluated prior to induction Oxygen Delivery Method: Circle System Utilized Preoxygenation: Pre-oxygenation with 100% oxygen Induction Type: IV induction Ventilation: Mask ventilation without difficulty Laryngoscope Size: Glidescope and 3 Grade View: Grade I Tube type: Oral Number of attempts: 1 Airway Equipment and Method: Stylet and Oral airway Placement Confirmation: ETT inserted through vocal cords under direct vision, positive ETCO2 and breath sounds checked- equal and bilateral Secured at: 21 cm Tube secured with: Tape Dental Injury: Teeth and Oropharynx as per pre-operative assessment  Comments: Intubated by SRNA. Atraumatic induction/intubation. GS 3 blade used d/t small mouth opening. Dentition and oral mucosa as per preop.

## 2024-08-27 NOTE — H&P (Signed)
 Chief Complaint  Patient presents with  Follow up on non-operative peforated appendicitis   Subjective   [name] is a 68 y.o. female established patient in today for: History of Present Illness [name] is a 68 year old female who presents for follow-up after hospitalization for appendicitis.  She was hospitalized approximately one month ago for appendicitis and has completed a course of antibiotics. She currently has no abdominal symptoms and her bowel movements are normal. There is no exacerbation of her irritable bowel syndrome.  There is no problem list on file for this patient.  Outpatient Medications Prior to Visit  Medication Sig Dispense Refill  escitalopram  oxalate (LEXAPRO ) 10 MG tablet escitalopram  10 mg tablet TAKE 1 TABLET BY MOUTH EVERY DAY  famotidine  (PEPCID ) 40 MG tablet Take 40 mg by mouth at bedtime  fenofibrate nanocrystallized (TRICOR) 145 MG tablet Take 145 mg by mouth once daily  levothyroxine (SYNTHROID) 112 MCG tablet Take 112 mcg by mouth every morning before breakfast (0630)  ondansetron  (ZOFRAN -ODT) 4 MG disintegrating tablet  rosuvastatin  (CRESTOR ) 20 MG tablet Take 20 mg by mouth once daily   No facility-administered medications prior to visit.    Objective   Vitals:  08/16/24 1121  Pulse: 94  Temp: 36.9 C (98.4 F)  SpO2: 96%  Weight: 77.2 kg (170 lb 3.2 oz)  Height: 165.1 cm (5' 5)  PainSc: 0-No pain   Body mass index is 28.32 kg/m. Physical Exam Constitutional:  Appearance: Normal appearance.  HENT:  Head: Normocephalic and atraumatic.  Pulmonary:  Effort: Pulmonary effort is normal.  Musculoskeletal:  General: Normal range of motion.  Cervical back: Normal range of motion.  Neurological:  General: No focal deficit present.  Mental Status: She is alert and oriented to person, place, and time. Mental status is at baseline.  Psychiatric:  Mood and Affect: Mood normal.  Behavior: Behavior normal.  Thought Content:  Thought content normal.     Assessment/Plan:   Assessment & Plan Prior acute appendicitis She had acute appendicitis treated with antibiotics, now asymptomatic but at risk of recurrence. Plan for interval appendectomy in next 1-2 months - Schedule interval laparoscopic appendectomy at [facility]. - Perform surgery under general anesthesia with three small incisions. - Discussed risks: 1% risk of intestinal injury, <1% risk of colon resection, rare <1% risk of ileocecal resection. - Advised no heavy lifting >20 pounds for three weeks post-surgery. - Schedule follow-up appointment post-surgery to ensure proper healing. Diagnoses and all orders for this visit:  Perforated appendicitis

## 2024-08-27 NOTE — Anesthesia Postprocedure Evaluation (Signed)
 Anesthesia Post Note  Patient: [name]  Procedure(s) Performed: APPENDECTOMY, LAPAROSCOPIC     Patient location during evaluation: PACU Anesthesia Type: General Level of consciousness: awake and alert and oriented Pain management: pain level controlled Vital Signs Assessment: post-procedure vital signs reviewed and stable Respiratory status: spontaneous breathing, nonlabored ventilation and respiratory function stable Cardiovascular status: blood pressure returned to baseline and stable Postop Assessment: no apparent nausea or vomiting Anesthetic complications: no   No notable events documented.  Last Vitals:  Vitals:   08/27/24 1645 08/27/24 1700  BP: 138/84 129/79  Pulse: 71 70  Resp: 17 16  Temp:    SpO2: 100% 97%    Last Pain:  Vitals:   08/27/24 1630  TempSrc:   PainSc: Asleep                 Verdell Dykman A.

## 2024-08-27 NOTE — Transfer of Care (Addendum)
 Immediate Anesthesia Transfer of Care Note  Patient: [name]  Procedure(s) Performed: APPENDECTOMY, LAPAROSCOPIC  Patient Location: PACU  Anesthesia Type:General  Level of Consciousness: awake and alert   Airway & Oxygen Therapy: Patient Spontanous Breathing and Patient connected to face mask oxygen  Post-op Assessment: Report given to RN and Post -op Vital signs reviewed and stable  Post vital signs: Reviewed and stable  Last Vitals:  Vitals Value Taken Time  BP 135/80 08/27/24 16:25  Temp    Pulse 66 08/27/24 16:29  Resp 20 08/27/24 16:29  SpO2 100 % 08/27/24 16:29  Vitals shown include unfiled device data.  Last Pain:  Vitals:   08/27/24 1212  TempSrc: Oral  PainSc: 0-No pain         Complications: No notable events documented.

## 2024-08-28 ENCOUNTER — Encounter (HOSPITAL_COMMUNITY): Payer: Self-pay | Admitting: General Surgery

## 2024-08-31 LAB — SURGICAL PATHOLOGY

## 2024-09-26 DIAGNOSIS — E785 Hyperlipidemia, unspecified: Secondary | ICD-10-CM | POA: Diagnosis not present

## 2024-09-26 DIAGNOSIS — D582 Other hemoglobinopathies: Secondary | ICD-10-CM | POA: Diagnosis not present

## 2024-09-26 DIAGNOSIS — R7303 Prediabetes: Secondary | ICD-10-CM | POA: Diagnosis not present

## 2024-09-26 DIAGNOSIS — R718 Other abnormality of red blood cells: Secondary | ICD-10-CM | POA: Diagnosis not present

## 2024-09-26 DIAGNOSIS — Z1231 Encounter for screening mammogram for malignant neoplasm of breast: Secondary | ICD-10-CM | POA: Diagnosis not present

## 2024-09-28 ENCOUNTER — Other Ambulatory Visit: Payer: Self-pay | Admitting: Physician Assistant

## 2024-10-04 DIAGNOSIS — E785 Hyperlipidemia, unspecified: Secondary | ICD-10-CM | POA: Diagnosis not present

## 2024-10-04 DIAGNOSIS — R7309 Other abnormal glucose: Secondary | ICD-10-CM | POA: Diagnosis not present

## 2024-10-04 DIAGNOSIS — E039 Hypothyroidism, unspecified: Secondary | ICD-10-CM | POA: Diagnosis not present

## 2024-10-04 DIAGNOSIS — G250 Essential tremor: Secondary | ICD-10-CM | POA: Diagnosis not present

## 2024-10-04 DIAGNOSIS — F418 Other specified anxiety disorders: Secondary | ICD-10-CM | POA: Diagnosis not present

## 2024-10-11 DIAGNOSIS — R682 Dry mouth, unspecified: Secondary | ICD-10-CM | POA: Diagnosis not present

## 2024-10-11 DIAGNOSIS — R7689 Other specified abnormal immunological findings in serum: Secondary | ICD-10-CM | POA: Diagnosis not present

## 2024-10-11 DIAGNOSIS — E663 Overweight: Secondary | ICD-10-CM | POA: Diagnosis not present

## 2024-10-11 DIAGNOSIS — I781 Nevus, non-neoplastic: Secondary | ICD-10-CM | POA: Diagnosis not present

## 2024-10-11 DIAGNOSIS — Z6828 Body mass index (BMI) 28.0-28.9, adult: Secondary | ICD-10-CM | POA: Diagnosis not present

## 2024-10-11 DIAGNOSIS — R21 Rash and other nonspecific skin eruption: Secondary | ICD-10-CM | POA: Diagnosis not present
